# Patient Record
Sex: Male | Born: 1950 | Hispanic: Yes | Marital: Single | Smoking: Never smoker
Health system: Southern US, Community
[De-identification: ages and names within clinical notes are randomized; demographics above are authoritative.]

---

## 2018-02-22 ENCOUNTER — Encounter: Payer: Self-pay | Admitting: Physician Assistant

## 2018-02-22 ENCOUNTER — Emergency Department
Admission: EM | Admit: 2018-02-22 | Discharge: 2018-02-22 | Disposition: A | Discharge status: Home / Self Care | Payer: No Typology Code available for payment source | Attending: Emergency Medicine | Admitting: Emergency Medicine

## 2018-02-22 ENCOUNTER — Other Ambulatory Visit: Payer: Self-pay

## 2018-02-22 ENCOUNTER — Emergency Department: Payer: No Typology Code available for payment source

## 2018-02-22 DIAGNOSIS — Y939 Activity, unspecified: Secondary | ICD-10-CM | POA: Diagnosis not present

## 2018-02-22 DIAGNOSIS — S161XXA Strain of muscle, fascia and tendon at neck level, initial encounter: Secondary | ICD-10-CM | POA: Diagnosis not present

## 2018-02-22 DIAGNOSIS — M62838 Other muscle spasm: Secondary | ICD-10-CM | POA: Diagnosis not present

## 2018-02-22 DIAGNOSIS — M549 Dorsalgia, unspecified: Secondary | ICD-10-CM | POA: Insufficient documentation

## 2018-02-22 DIAGNOSIS — M25512 Pain in left shoulder: Secondary | ICD-10-CM | POA: Diagnosis not present

## 2018-02-22 DIAGNOSIS — S199XXA Unspecified injury of neck, initial encounter: Secondary | ICD-10-CM | POA: Diagnosis present

## 2018-02-22 DIAGNOSIS — Y92410 Unspecified street and highway as the place of occurrence of the external cause: Secondary | ICD-10-CM | POA: Insufficient documentation

## 2018-02-22 DIAGNOSIS — Y998 Other external cause status: Secondary | ICD-10-CM | POA: Diagnosis not present

## 2018-02-22 MED ORDER — BACLOFEN 10 MG PO TABS: 10.0000 mg | ORAL_TABLET | Freq: Every day | ORAL | 1 refills | Status: AC

## 2018-02-22 MED ORDER — MELOXICAM 15 MG PO TABS: 15.0000 mg | ORAL_TABLET | Freq: Every day | ORAL | 2 refills | Status: AC
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
## 2023-05-23 ENCOUNTER — Other Ambulatory Visit: Payer: Self-pay

## 2023-05-23 ENCOUNTER — Emergency Department: Payer: Self-pay

## 2023-05-23 ENCOUNTER — Emergency Department
Admission: EM | Admit: 2023-05-23 | Discharge: 2023-05-23 | Disposition: A | Discharge status: Home / Self Care | Payer: Self-pay | Attending: Emergency Medicine | Admitting: Emergency Medicine

## 2023-05-23 DIAGNOSIS — S61213A Laceration without foreign body of left middle finger without damage to nail, initial encounter: Secondary | ICD-10-CM | POA: Insufficient documentation

## 2023-05-23 DIAGNOSIS — Z23 Encounter for immunization: Secondary | ICD-10-CM | POA: Insufficient documentation

## 2023-05-23 DIAGNOSIS — W230XXA Caught, crushed, jammed, or pinched between moving objects, initial encounter: Secondary | ICD-10-CM | POA: Insufficient documentation

## 2023-05-23 DIAGNOSIS — S62633B Displaced fracture of distal phalanx of left middle finger, initial encounter for open fracture: Secondary | ICD-10-CM | POA: Insufficient documentation

## 2023-05-23 DIAGNOSIS — T148XXA Other injury of unspecified body region, initial encounter: Secondary | ICD-10-CM

## 2023-05-23 MED ORDER — CEPHALEXIN 500 MG PO CAPS: 500.0000 mg | ORAL_CAPSULE | Freq: Four times a day (QID) | ORAL | 0 refills | Status: AC

## 2023-05-23 MED ORDER — LIDOCAINE HCL 1 % IJ SOLN
10.0000 mL | Freq: Once | INTRAMUSCULAR | Status: AC
  Administered 2023-05-23: 10 mL
  Filled 2023-05-23: qty 10

## 2023-05-23 MED ORDER — TETANUS-DIPHTH-ACELL PERTUSSIS 5-2.5-18.5 LF-MCG/0.5 IM SUSY
0.5000 mL | PREFILLED_SYRINGE | Freq: Once | INTRAMUSCULAR | Status: AC
  Administered 2023-05-23: 0.5 mL via INTRAMUSCULAR
  Filled 2023-05-23: qty 0.5

## 2023-05-23 NOTE — ED Notes (Signed)
EDP at bedside  

## 2023-05-23 NOTE — Discharge Instructions (Addendum)
Tome [employer] cuatro veces al da durante los prximos [place].  Retirar las suturas en [employer].  Mantenga la herida limpia y seca durante las prximas veinticuatro horas.  Limpie la herida con agua y jabn una vez al da.  Por favor, haga un seguimiento con el Dr. Mathis Bud

## 2023-05-23 NOTE — ED Provider Triage Note (Signed)
Emergency Medicine Provider Triage Evaluation Note  Joseph Montoya , a 72 y.o. male  was evaluated in triage.  Pt complains of laceration left middle finger, unsure of last tdap.  Review of Systems  Positive:  Negative:   Physical Exam  There were no vitals taken for this visit. Gen:   Awake, no distress   Resp:  Normal effort  MSK:   Moves extremities without difficulty  Other:    Medical Decision Making  Medically screening exam initiated at 6:12 PM.  Appropriate orders placed.  Joseph Montoya was informed that the remainder of the evaluation will be completed by another provider, this initial triage assessment does not replace that evaluation, and the importance of remaining in the ED until their evaluation is complete.     Faythe Ghee, PA-C 05/23/23 1812

## 2023-05-23 NOTE — ED Triage Notes (Signed)
Pt to ED POV for injury to L middle finger from slamming it into metal doorframe. PA examined finger in triage. End of finger slightly crooked with deep laceration. Pt in severe pain. Unknown las Tdap.Marland Kitchen

## 2023-05-23 NOTE — ED Provider Notes (Signed)
[facility] Provider Note  Patient Contact: 8:38 PM (approximate)   History   Finger Injury   HPI  Joseph Montoya is a 72 y.o. male presents to the emergency department after patient had a crush type injury to the left middle finger.  Patient has a 3 cm x 3 cm laceration with fingernail disruption.  Tetanus status needs updating.  No numbness or tingling in the left hand.      Physical Exam   Triage Vital Signs: ED Triage Vitals [05/23/23 1812]  Enc Vitals Group     BP (!) 147/75     Pulse Rate 75     Resp 20     Temp 98.7 F (37.1 C)     Temp Source Oral     SpO2 100 %     Weight 195 lb (88.5 kg)     Height 5\' 3"  (1.6 m)     Head Circumference      Peak Flow      Pain Score 10     Pain Loc      Pain Edu?      Excl. in GC?     Most recent vital signs: Vitals:   05/23/23 1812 05/23/23 2027  BP: (!) 147/75 (!) 143/74  Pulse: 75 72  Resp: 20 18  Temp: 98.7 F (37.1 C) 98.2 F (36.8 C)  SpO2: 100% 100%     General: Alert and in no acute distress. Eyes:  PERRL. EOMI. Head: No acute traumatic findings ENT:      Nose: No congestion/rhinnorhea.      Mouth/Throat: Mucous membranes are moist. Neck: No stridor. No cervical spine tenderness to palpation. Cardiovascular:  Good peripheral perfusion Respiratory: Normal respiratory effort without tachypnea or retractions. Lungs CTAB. Good air entry to the bases with no decreased or absent breath sounds. Gastrointestinal: Bowel sounds 4 quadrants. Soft and nontender to palpation. No guarding or rigidity. No palpable masses. No distention. No CVA tenderness. Musculoskeletal: Patient performs limited range of motion at left middle finger.  Palpable radial ulnar pulses bilaterally and symmetrically. Neurologic:  No gross focal neurologic deficits are appreciated.  Skin: Patient has macerated 3 cm laceration with fingernail disruption.   ED Results / Procedures / Treatments   Labs (all labs  ordered are listed, but only abnormal results are displayed) Labs Reviewed - No data to display      RADIOLOGY  I personally viewed and evaluated these images as part of my medical decision making, as well as reviewing the written report by the radiologist.  ED Provider Interpretation: Comminuted fracture visualized at distal phalanx of left middle finger.   PROCEDURES:  Critical Care performed: No  ..Laceration Repair  Date/Time: 05/23/2023 8:40 PM  Performed by: Orvil Feil, PA-C Authorized by: Orvil Feil, PA-C   Consent:    Consent obtained:  Verbal   Risks discussed:  Infection and pain Universal protocol:    Procedure explained and questions answered to patient or proxy's satisfaction: yes     Patient identity confirmed:  Verbally with patient Anesthesia:    Anesthesia method:  Nerve block   Block anesthetic:  Lidocaine 1% w/o epi Laceration details:    Location:  Finger   Finger location:  L long finger   Length (cm):  3   Depth (mm):  5 Pre-procedure details:    Preparation:  Patient was prepped and draped in usual sterile fashion Exploration:    Limited defect created (wound extended): no  Imaging obtained: x-ray     Imaging outcome: foreign body not noted     Contaminated: yes   Treatment:    Area cleansed with:  Povidone-iodine   Amount of cleaning:  Extensive   Irrigation solution:  Sterile saline   Debridement:  None Skin repair:    Repair method:  Sutures   Suture size:  5-0   Suture technique:  Running locked   Number of sutures:  12 Approximation:    Approximation:  Close Repair type:    Repair type:  Intermediate Post-procedure details:    Dressing:  Non-adherent dressing, splint for protection and sterile dressing   Procedure completion:  Tolerated well, no immediate complications    MEDICATIONS ORDERED IN ED: Medications  Tdap (BOOSTRIX) injection 0.5 mL (0.5 mLs Intramuscular Given 05/23/23 1938)  lidocaine (XYLOCAINE) 1 %  (with pres) injection 10 mL (10 mLs Infiltration Given by Other 05/23/23 1954)     IMPRESSION / MDM / ASSESSMENT AND PLAN / ED COURSE  I reviewed the triage vital signs and the nursing notes.                              Assessment and plan Open fracture 72 year old male presents to the emergency department with a macerated laceration of the left middle finger with underlying open fracture.  Patient's laceration was repaired easily in the emergency department and a splint was provided for protection.  Patient was advised to follow-up with hand specialist, Dr. Stephenie Acres.  Return precautions were given to return with new or worsening symptoms.   FINAL CLINICAL IMPRESSION(S) / ED DIAGNOSES   Final diagnoses:  Open fracture     Rx / DC Orders   ED Discharge Orders          Ordered    cephALEXin (KEFLEX) 500 MG capsule  4 times daily        05/23/23 2017             Note:  This document was prepared using Dragon voice recognition software and may include unintentional dictation errors.   Pia Mau [place], Cordelia Poche 05/23/23 2042    Pilar Jarvis, MD 05/24/23 [phone]

## 2023-06-05 ENCOUNTER — Emergency Department
Admission: EM | Admit: 2023-06-05 | Discharge: 2023-06-05 | Disposition: A | Discharge status: Home / Self Care | Payer: Self-pay | Attending: Emergency Medicine | Admitting: Emergency Medicine

## 2023-06-05 ENCOUNTER — Other Ambulatory Visit: Payer: Self-pay

## 2023-06-05 DIAGNOSIS — Z5189 Encounter for other specified aftercare: Secondary | ICD-10-CM

## 2023-06-05 DIAGNOSIS — M7989 Other specified soft tissue disorders: Secondary | ICD-10-CM | POA: Insufficient documentation

## 2023-06-05 DIAGNOSIS — Z4800 Encounter for change or removal of nonsurgical wound dressing: Secondary | ICD-10-CM | POA: Insufficient documentation

## 2023-06-05 DIAGNOSIS — X58XXXD Exposure to other specified factors, subsequent encounter: Secondary | ICD-10-CM | POA: Insufficient documentation

## 2023-06-05 DIAGNOSIS — S62663D Nondisplaced fracture of distal phalanx of left middle finger, subsequent encounter for fracture with routine healing: Secondary | ICD-10-CM | POA: Insufficient documentation

## 2023-06-05 MED ORDER — HYDROCODONE-ACETAMINOPHEN 5-325 MG PO TABS: 1.0000 | ORAL_TABLET | Freq: Once | ORAL | Status: DC

## 2023-06-05 MED ORDER — HYDROCODONE-ACETAMINOPHEN 5-325 MG PO TABS
1.0000 | ORAL_TABLET | Freq: Once | ORAL | Status: AC
  Administered 2023-06-05: 1 via ORAL
  Filled 2023-06-05: qty 1

## 2023-06-05 MED ORDER — HYDROCODONE-ACETAMINOPHEN 5-325 MG PO TABS: 1.0000 | ORAL_TABLET | ORAL | 0 refills | Status: AC | PRN
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
## 2023-06-05 NOTE — ED Provider Notes (Signed)
[facility] Provider Note  Patient Contact: 5:56 PM (approximate)   History   Finger Injury   HPI  Joseph Montoya is a 72 y.o. male who presents the emergency department for evaluation of his finger.  Patient was seen in this department roughly 10 days ago with an open fracture of the middle finger of the left hand.  Patient had this splinted, was placed in a splint.  According to family they remove the splint so they can change the dressing, have not had him in a splint ongoing.  They were concerned as he had some clear drainage from bowel to the nailbed.  There is been no pus.  No gross erythema to the finger still edematous from the original trauma.  Patient has not followed up with orthopedic.     Physical Exam   Triage Vital Signs: ED Triage Vitals  Encounter Vitals Group     BP 06/05/23 1642 (!) 164/74     Systolic BP Percentile --      Diastolic BP Percentile --      Pulse Rate 06/05/23 1642 86     Resp 06/05/23 1642 18     Temp 06/05/23 1642 98.6 F (37 C)     Temp src --      SpO2 06/05/23 1642 95 %     Weight 06/05/23 1644 194 lb 0.1 oz (88 kg)     Height 06/05/23 1644 5\' 3"  (1.6 m)     Head Circumference --      Peak Flow --      Pain Score 06/05/23 1642 10     Pain Loc --      Pain Education --      Exclude from Growth Chart --     Most recent vital signs: Vitals:   06/05/23 1642  BP: (!) 164/74  Pulse: 86  Resp: 18  Temp: 98.6 F (37 C)  SpO2: 95%     General: Alert and in no acute distress   Cardiovascular:  Good peripheral perfusion Respiratory: Normal respiratory effort without tachypnea or retractions. Lungs CTAB. Musculoskeletal: Full range of motion to all extremities.  Visualization of the middle finger of the left hand reveals in place stitches, no gross edema of the digit and some residual ecchymosis.  There is no erythema.  There is no purulent drainage.  Wound appears to be healing at this time though there is  concerned that his sutures are removed at this time there may be dehiscence of this wound.   Neurologic:  No gross focal neurologic deficits are appreciated.  Skin:   No rash noted Other:   ED Results / Procedures / Treatments   Labs (all labs ordered are listed, but only abnormal results are displayed) Labs Reviewed - No data to display   EKG     RADIOLOGY    No results found.  PROCEDURES:  Critical Care performed: No  Procedures   MEDICATIONS ORDERED IN ED: Medications  HYDROcodone-acetaminophen (NORCO/VICODIN) 5-325 MG per tablet 1 tablet (has no administration in time range)     IMPRESSION / MDM / ASSESSMENT AND PLAN / ED COURSE  I reviewed the triage vital signs and the nursing notes.                                 Differential diagnosis includes, but is not limited to, suture removal, wound infection, wound dehiscence, worsening fracture  alignment, osteomyelitis   Patient's presentation is most consistent with acute presentation with potential threat to life or bodily function.   Patient's diagnosis is consistent with open fracture, subsequent encounter.  Patient presents the emergency department for reevaluation of an open fracture he sustained roughly 10 days ago.  Patient was seen in this department, wound was sutured.  Patient was referred to orthopedics but has not followed up.  They were concerned as he has ongoing pain, swelling and drainage from the finger.  It appears that patient has some serosanguineous type drainage from the laceration component though there is no evidence of infection on physical exam.  Patient's finger is still edematous from original trauma was still residual ecchymosis.  At this time I recommend resplinting the patient's finger, having the patient follow-up with orthopedics.  I do not feel that wound is ready for suture removal at this time.  These instructions are discussed with patient and family and they are agreeable with  this plan..  Patient is given ED precautions to return to the ED for any worsening or new symptoms.     FINAL CLINICAL IMPRESSION(S) / ED DIAGNOSES   Final diagnoses:  Open nondisplaced fracture of distal phalanx of left middle finger with routine healing, subsequent encounter  Visit for wound check     Rx / DC Orders   ED Discharge Orders          Ordered    HYDROcodone-acetaminophen (NORCO/VICODIN) 5-325 MG tablet  Every 4 hours PRN        06/05/23 1803             Note:  This document was prepared using Dragon voice recognition software and may include unintentional dictation errors.   Lanette Hampshire 06/05/23 1803    Jene Every, MD 06/05/23 [phone]

## 2024-12-15 ENCOUNTER — Emergency Department: Payer: Self-pay

## 2024-12-15 ENCOUNTER — Inpatient Hospital Stay
Admission: EM | Admit: 2024-12-15 | Payer: Self-pay | Source: Home / Self Care | Attending: Internal Medicine | Admitting: Internal Medicine

## 2024-12-15 ENCOUNTER — Inpatient Hospital Stay: Payer: Self-pay

## 2024-12-15 ENCOUNTER — Other Ambulatory Visit: Payer: Self-pay

## 2024-12-15 DIAGNOSIS — R0602 Shortness of breath: Secondary | ICD-10-CM

## 2024-12-15 DIAGNOSIS — I509 Heart failure, unspecified: Principal | ICD-10-CM

## 2024-12-15 DIAGNOSIS — D696 Thrombocytopenia, unspecified: Secondary | ICD-10-CM | POA: Insufficient documentation

## 2024-12-15 DIAGNOSIS — R7989 Other specified abnormal findings of blood chemistry: Secondary | ICD-10-CM | POA: Insufficient documentation

## 2024-12-15 DIAGNOSIS — I10 Essential (primary) hypertension: Secondary | ICD-10-CM | POA: Insufficient documentation

## 2024-12-15 DIAGNOSIS — I4891 Unspecified atrial fibrillation: Secondary | ICD-10-CM | POA: Insufficient documentation

## 2024-12-15 LAB — CBC
HCT: 42.2 % (ref 39.0–52.0)
Hemoglobin: 13.9 g/dL (ref 13.0–17.0)
MCH: 32.8 pg (ref 26.0–34.0)
MCHC: 32.9 g/dL (ref 30.0–36.0)
MCV: 99.5 fL (ref 80.0–100.0)
Platelets: 131 10*3/uL — ABNORMAL LOW (ref 150–400)
RBC: 4.24 MIL/uL (ref 4.22–5.81)
RDW: 13.9 % (ref 11.5–15.5)
WBC: 7 10*3/uL (ref 4.0–10.5)
nRBC: 0 % (ref 0.0–0.2)

## 2024-12-15 LAB — BASIC METABOLIC PANEL WITH GFR
Anion gap: 9 (ref 5–15)
BUN: 14 mg/dL (ref 8–23)
CO2: 25 mmol/L (ref 22–32)
Calcium: 9.3 mg/dL (ref 8.9–10.3)
Chloride: 108 mmol/L (ref 98–111)
Creatinine, Ser: 0.87 mg/dL (ref 0.61–1.24)
GFR, Estimated: >60 mL/min
Glucose, Bld: 126 mg/dL — ABNORMAL HIGH (ref 70–99)
Potassium: 3.9 mmol/L (ref 3.5–5.1)
Sodium: 142 mmol/L (ref 135–145)

## 2024-12-15 LAB — MAGNESIUM: Magnesium: 2.1 mg/dL (ref 1.7–2.4)

## 2024-12-15 LAB — LIPID PANEL
Cholesterol: 164 mg/dL (ref 0–200)
HDL: 34 mg/dL — ABNORMAL LOW
LDL Cholesterol: 103 mg/dL — ABNORMAL HIGH (ref 0–99)
Total CHOL/HDL Ratio: 4.9 ratio
Triglycerides: 134 mg/dL
VLDL: 27 mg/dL (ref 0–40)

## 2024-12-15 LAB — HEPATIC FUNCTION PANEL
ALT: 79 U/L — ABNORMAL HIGH (ref 0–44)
AST: 40 U/L (ref 15–41)
Albumin: 4.3 g/dL (ref 3.5–5.0)
Alkaline Phosphatase: 115 U/L (ref 38–126)
Bilirubin, Direct: 0.3 mg/dL — ABNORMAL HIGH (ref 0.0–0.2)
Indirect Bilirubin: 0.5 mg/dL (ref 0.3–0.9)
Total Bilirubin: 0.7 mg/dL (ref 0.0–1.2)
Total Protein: 7.1 g/dL (ref 6.5–8.1)

## 2024-12-15 LAB — TROPONIN T, HIGH SENSITIVITY
Troponin T High Sensitivity: 24 ng/L — ABNORMAL HIGH (ref 0–19)
Troponin T High Sensitivity: 24 ng/L — ABNORMAL HIGH (ref 0–19)

## 2024-12-15 LAB — IMMATURE PLATELET FRACTION: Immature Platelet Fraction: 8.3 % (ref 1.2–8.6)

## 2024-12-15 LAB — TSH: TSH: 2.8 u[IU]/mL (ref 0.350–4.500)

## 2024-12-15 LAB — PRO BRAIN NATRIURETIC PEPTIDE: Pro Brain Natriuretic Peptide: 1984 pg/mL — ABNORMAL HIGH

## 2024-12-15 MED ORDER — SENNOSIDES-DOCUSATE SODIUM 8.6-50 MG PO TABS
1.0000 | ORAL_TABLET | Freq: Every evening | ORAL | Status: AC | PRN
  Administered 2024-12-17: 1 via ORAL
  Filled 2024-12-15: qty 1

## 2024-12-15 MED ORDER — SODIUM CHLORIDE 0.9 % IV SOLN
100.0000 mg | Freq: Two times a day (BID) | INTRAVENOUS | Status: AC
  Administered 2024-12-15 – 2024-12-18 (×6): 100 mg via INTRAVENOUS
  Filled 2024-12-15 (×7): qty 100

## 2024-12-15 MED ORDER — SODIUM CHLORIDE 0.9% FLUSH
3.0000 mL | Freq: Two times a day (BID) | INTRAVENOUS | Status: AC
  Administered 2024-12-15 – 2024-12-20 (×11): 3 mL via INTRAVENOUS

## 2024-12-15 MED ORDER — DILTIAZEM HCL 25 MG/5ML IV SOLN
15.0000 mg | Freq: Once | INTRAVENOUS | Status: AC
  Administered 2024-12-15: 15 mg via INTRAVENOUS
  Filled 2024-12-15: qty 5

## 2024-12-15 MED ORDER — IOHEXOL 350 MG/ML SOLN
100.0000 mL | Freq: Once | INTRAVENOUS | Status: AC | PRN
  Administered 2024-12-15: 100 mL via INTRAVENOUS

## 2024-12-15 MED ORDER — ONDANSETRON HCL 4 MG PO TABS: 4.0000 mg | ORAL_TABLET | Freq: Four times a day (QID) | ORAL | Status: AC | PRN

## 2024-12-15 MED ORDER — DILTIAZEM HCL 25 MG/5ML IV SOLN
10.0000 mg | Freq: Once | INTRAVENOUS | Status: AC
  Administered 2024-12-15: 10 mg via INTRAVENOUS
  Filled 2024-12-15: qty 5

## 2024-12-15 MED ORDER — FUROSEMIDE 10 MG/ML IJ SOLN
40.0000 mg | Freq: Once | INTRAMUSCULAR | Status: AC
  Administered 2024-12-15: 40 mg via INTRAVENOUS
  Filled 2024-12-15: qty 4

## 2024-12-15 MED ORDER — POTASSIUM CHLORIDE 20 MEQ PO PACK
20.0000 meq | PACK | Freq: Two times a day (BID) | ORAL | Status: DC
  Administered 2024-12-15 – 2024-12-17 (×5): 20 meq via ORAL
  Filled 2024-12-15 (×5): qty 1

## 2024-12-15 MED ORDER — ACETAMINOPHEN 650 MG RE SUPP: 650.0000 mg | Freq: Four times a day (QID) | RECTAL | Status: AC | PRN

## 2024-12-15 MED ORDER — METOPROLOL TARTRATE 5 MG/5ML IV SOLN
2.5000 mg | INTRAVENOUS | Status: DC | PRN
  Administered 2024-12-15: 2.5 mg via INTRAVENOUS
  Filled 2024-12-15: qty 5

## 2024-12-15 MED ORDER — ACETAMINOPHEN 325 MG PO TABS
650.0000 mg | ORAL_TABLET | Freq: Four times a day (QID) | ORAL | Status: AC | PRN
  Administered 2024-12-16 – 2024-12-19 (×6): 650 mg via ORAL
  Filled 2024-12-15 (×6): qty 2

## 2024-12-15 MED ORDER — ONDANSETRON HCL 4 MG/2ML IJ SOLN: 4.0000 mg | Freq: Four times a day (QID) | INTRAMUSCULAR | Status: AC | PRN

## 2024-12-15 MED ORDER — FUROSEMIDE 10 MG/ML IJ SOLN
40.0000 mg | Freq: Two times a day (BID) | INTRAMUSCULAR | Status: AC
  Administered 2024-12-15 – 2024-12-20 (×11): 40 mg via INTRAVENOUS
  Filled 2024-12-15 (×11): qty 4

## 2024-12-15 MED ORDER — METOPROLOL TARTRATE 5 MG/5ML IV SOLN
2.5000 mg | INTRAVENOUS | Status: AC | PRN
  Administered 2024-12-15 – 2024-12-16 (×2): 2.5 mg via INTRAVENOUS
  Filled 2024-12-15 (×2): qty 5

## 2024-12-15 MED ORDER — SODIUM CHLORIDE 0.9 % IV SOLN
2.0000 g | INTRAVENOUS | Status: AC
  Administered 2024-12-15 – 2024-12-17 (×3): 2 g via INTRAVENOUS
  Filled 2024-12-15 (×3): qty 20

## 2024-12-15 NOTE — ED Notes (Signed)
 Pt wanting to drink water so requesting BiPap mask be taken off. Pt placed on 4L [place], respirations even, non labored. Respiratory notified.

## 2024-12-15 NOTE — ED Provider Notes (Signed)
 [facility] Provider Note    Event Date/Time   First MD Initiated Contact with Patient 12/15/24 1456     (approximate)   History   No chief complaint on file.   HPI  Joseph Montoya is a 74 y.o. male presenting with shortness of breath for the past week, also notes abdominal distention.  He denies any chest pain or cough.  States no prior medical history, no history of any cardiac issues.  Denies hypertension or diabetes.  Denies any fever.  Denies frequent alcohol use, no history of liver cirrhosis or liver issues, denies history of renal problems.  Per independent history from family, patient is otherwise healthy.  No cardiac history, no history of CHF or arrhythmia.  Does not take any medications on a regular basis.  Spanish interpreter was used for encounter.     Physical Exam   Triage Vital Signs: ED Triage Vitals  Encounter Vitals Group     BP 12/15/24 1425 137/61     Girls Systolic BP Percentile --      Girls Diastolic BP Percentile --      Boys Systolic BP Percentile --      Boys Diastolic BP Percentile --      Pulse Rate 12/15/24 1425 (!) 132     Resp 12/15/24 1425 20     Temp 12/15/24 1425 97.8 F (36.6 C)     Temp Source 12/15/24 1425 Oral     SpO2 12/15/24 1425 94 %     Weight 12/15/24 1426 194 lb 0.1 oz (88 kg)     Height --      Head Circumference --      Peak Flow --      Pain Score 12/15/24 1426 9     Pain Loc --      Pain Education --      Exclude from Growth Chart --     Most recent vital signs: Vitals:   12/15/24 1530 12/15/24 1630  BP: (!) 143/105 (!) 141/97  Pulse: (!) 101 (!) 159  Resp: (!) 22 (!) 22  Temp:    SpO2: 93% 98%     General: Awake, no distress.  CV:  Good peripheral perfusion.  Resp:  Normal effort.  Tachypneic, having increased work of breathing Abd:  Soft, nontender, mildly distended Other:  Trace lower extremity edema bilaterally, appears symmetrical   ED Results / Procedures / Treatments    Labs (all labs ordered are listed, but only abnormal results are displayed) Labs Reviewed  BASIC METABOLIC PANEL WITH GFR - Abnormal; Notable for the following components:      Result Value   Glucose, Bld 126 (*)    All other components within normal limits  CBC - Abnormal; Notable for the following components:   Platelets 131 (*)    All other components within normal limits  PRO BRAIN NATRIURETIC PEPTIDE - Abnormal; Notable for the following components:   Pro Brain Natriuretic Peptide 1,984.0 (*)    All other components within normal limits  HEPATIC FUNCTION PANEL - Abnormal; Notable for the following components:   ALT 79 (*)    Bilirubin, Direct 0.3 (*)    All other components within normal limits  TROPONIN T, HIGH SENSITIVITY - Abnormal; Notable for the following components:   Troponin T High Sensitivity 24 (*)    All other components within normal limits  TROPONIN T, HIGH SENSITIVITY - Abnormal; Notable for the following components:   Troponin T  High Sensitivity 24 (*)    All other components within normal limits     EKG  EKG shows, atrial fibrillation with RVR, rate 144, normal QS, normal QTc, no obvious ischemic ST elevation, T wave flattening to aVL, no prior to compare   RADIOLOGY On my independent interpretation, chest x-ray shows vascular congestion   PROCEDURES:  Critical Care performed: Yes, see critical care procedure note(s)  .Critical Care  Performed by: Waymond Lorelle Cummins, MD Authorized by: Waymond Lorelle Cummins, MD   Critical care provider statement:    Critical care time (minutes):  40   Critical care was time spent personally by me on the following activities:  Development of treatment plan with patient or surrogate, discussions with consultants, evaluation of patient's response to treatment, examination of patient, ordering and review of laboratory studies, ordering and review of radiographic studies, ordering and performing treatments and interventions, pulse  oximetry, re-evaluation of patient's condition and review of old charts    MEDICATIONS ORDERED IN ED: Medications  furosemide  (LASIX ) injection 40 mg (40 mg Intravenous Given 12/15/24 1553)  diltiazem  (CARDIZEM ) injection 10 mg (10 mg Intravenous Given 12/15/24 1550)  diltiazem  (CARDIZEM ) injection 15 mg (15 mg Intravenous Given 12/15/24 1717)     IMPRESSION / MDM / ASSESSMENT AND PLAN / ED COURSE  I reviewed the triage vital signs and the nursing notes.                              Differential diagnosis includes, but is not limited to, arrhythmia, electrolyte derangements, CHF exacerbation, volume overload, atypical ACS.  Labs were obtained out of triage, will add a BNP, will give him some IV Lasix  here, IV Dilt, BiPAP.  He will need to be admitted for further management.  Patient's presentation is most consistent with acute presentation with potential threat to life or bodily function.  Independent interpretation of labs and imaging below.  Clinical course as below.  Has new onset CHF, A-fib RVR, he will need to be admitted for further management.  Consulted hospitalist, he is admitted.   Clinical Course as of 12/15/24 1744  Sun Dec 15, 2024  1459 DG Chest [place] 1 View IMPRESSION: 1. Cardiomegaly with pulmonary vascular congestion. 2. Interstitial prominence bilaterally with airspace disease at the left lung base, possible edema or infiltrate.   [TT]  1544 Independent review of labs, troponins not severely deranged, troponins mildly elevated, BNP is elevated, no leukocytosis. [TT]  1742 Heart rate after repeat Dilt now 100-110s. [TT]    Clinical Course User Index [TT] Waymond Lorelle Cummins, MD     FINAL CLINICAL IMPRESSION(S) / ED DIAGNOSES   Final diagnoses:  Acute on chronic congestive heart failure, unspecified heart failure type (HCC)  Atrial fibrillation with rapid ventricular response (HCC)  Shortness of breath     Rx / DC Orders   ED Discharge Orders     None         Note:  This document was prepared using Dragon voice recognition software and may include unintentional dictation errors.    Waymond Lorelle Cummins, MD 12/15/24 [phone]

## 2024-12-15 NOTE — ED Notes (Signed)
 Called CCMD for pt monitoring

## 2024-12-15 NOTE — ED Triage Notes (Signed)
 Arrives c/o one week history of SOB.  Worse with excertion. Denies fever/chills. No history of same.  Also c/o chest and upper back x 1 week.

## 2024-12-15 NOTE — H&P (Addendum)
 " History and Physical    [name]:[id number] DOB: [birth year] DOA: 12/15/2024  DOS: the patient was seen and examined on 12/15/2024  PCP: Pcp, No   Patient coming from: Home  I have personally briefly reviewed patient's old medical records in [facility] and CareEverywhere  HPI:   Joseph Montoya is a 74 y.o. year old male without significant medical history presenting to the ED with worsening shortness of breath, chest pain with radiation to the back for 1 week.  Interpreter utilized for this interview.  He states his symptoms have started slowly and have been progressing for 1 week.  He does not recall an inciting factor.  He states he last saw his primary care doctor 9 months ago.  He is unable to recall the name of the office.  He states he does not have any medical problems and does not take any medications.  He denies any sick contacts.  Denies any fevers or chills.  He does report orthopnea and abdominal distention for 1 week.  He does not recall his baseline weight.  He was previously a heavy drinker but has stopped for 1 year since he has moved to live with his daughter.  He was previously smoking 1/4 pack per day but has stopped that over 1 year as well. On arrival to the ED patient was noted to be HDS stable.  Patient was noted to be in RVR so diltiazem  push was given.  Lab work and imaging obtained.  CBC without leukocytosis but mild thrombocytopenia.  BMP with mild hyperglycemia otherwise unremarkable with normal electrolytes.  Potassium noted with 3.9.  Troponin mildly elevated 24 with repeat at 24.  proBNP elevated at 1984.  Hepatic function with elevated ALT.  Chest x-ray with cardiomegaly and concerning for vascular congestion.  There is mediastinal enlargement on my review of chest x-ray.  Patient given IV Lasix  and TRH consulted for admission and further management.  Review of Systems: As mentioned in the history of present illness. All other systems reviewed and are  negative.   Medical history: No reported past medical history  No past surgical history on file.   Allergies[1]  No family history on file.  Medications: No reported chronic medications  Social History:  reports that he has never smoked. He has never used smokeless tobacco. He reports that he does not currently use alcohol. He reports that he does not use drugs. Lives with daughter, not currently smoking or drinking.  Independent ADLs and IADLs   Physical Exam: Vitals:   12/15/24 1630 12/15/24 1700 12/15/24 1715 12/15/24 1800  BP: (!) 141/97 (!) 140/115  119/89  Pulse: (!) 159 (!) 162 83 (!) 49  Resp: (!) 22 (!) 23 (!) 21 17  Temp:      TempSrc:      SpO2: 98% 99% 98% 100%  Weight:        Gen: NAD HENT: NCAT, BiPAP in place CV: Irregularly irregular, good pulses, unable to evaluate JVD given body habitus, 1+ pitting edema Lung: Bibasilar rales Abd: No TTP, distended abdomen, normal bowel sounds MSK: No asymmetry, good bulk and tone, skin tag noted on right mid back Neuro: alert and oriented x 4   Labs on Admission: I have personally reviewed following labs and imaging studies  CBC: Recent Labs  Lab 12/15/24 1434  WBC 7.0  HGB 13.9  HCT 42.2  MCV 99.5  PLT 131*   Basic Metabolic Panel: Recent Labs  Lab 12/15/24 1434  NA 142  K 3.9  CL 108  CO2 25  GLUCOSE 126*  BUN 14  CREATININE 0.87  CALCIUM 9.3   GFR: CrCl cannot be calculated (Unknown ideal weight.). Liver Function Tests: Recent Labs  Lab 12/15/24 1434  AST 40  ALT 79*  ALKPHOS 115  BILITOT 0.7  PROT 7.1  ALBUMIN 4.3   No results for input(s): LIPASE, AMYLASE in the last 168 hours. No results for input(s): AMMONIA in the last 168 hours. Coagulation Profile: No results for input(s): INR, PROTIME in the last 168 hours. Cardiac Enzymes: No results for input(s): CKTOTAL, CKMB, CKMBINDEX, TROPONINI, TROPONINIHS in the last 168 hours. BNP (last 3 results) No results  for input(s): BNP in the last 8760 hours. HbA1C: No results for input(s): HGBA1C in the last 72 hours. CBG: No results for input(s): GLUCAP in the last 168 hours. Lipid Profile: No results for input(s): CHOL, HDL, LDLCALC, TRIG, CHOLHDL, LDLDIRECT in the last 72 hours. Thyroid Function Tests: No results for input(s): TSH, T4TOTAL, FREET4, T3FREE, THYROIDAB in the last 72 hours. Anemia Panel: No results for input(s): VITAMINB12, FOLATE, FERRITIN, TIBC, IRON, RETICCTPCT in the last 72 hours. Urine analysis: No results found for: COLORURINE, APPEARANCEUR, LABSPEC, PHURINE, GLUCOSEU, HGBUR, BILIRUBINUR, KETONESUR, PROTEINUR, UROBILINOGEN, NITRITE, LEUKOCYTESUR  Radiological Exams on Admission: I have personally reviewed images DG Chest Port 1 View Result Date: 12/15/2024 CLINICAL DATA:  Shortness of breath. EXAM: PORTABLE CHEST 1 VIEW COMPARISON:  None Available. FINDINGS: Heart is markedly enlarged and the mediastinal contour is prominent. There is atherosclerotic calcification of the aorta. The pulmonary vasculature is distended. Interstitial prominence is present bilaterally with mild airspace disease at the left lung base. No effusion or pneumothorax is seen. No acute osseous abnormality. IMPRESSION: 1. Cardiomegaly with pulmonary vascular congestion. 2. Interstitial prominence bilaterally with airspace disease at the left lung base, possible edema or infiltrate. Electronically Signed   By: Leita Birmingham M.D.   On: 12/15/2024 14:47    EKG: My personal interpretation of EKG shows: A-fib with RVR without any acute ST changes.  There is a fascicular block present.    Assessment/Plan Principal Problem:   New onset of congestive heart failure (HCC) Active Problems:   Atrial fibrillation with RVR (HCC)   Thrombocytopenia   Elevated LFTs   Essential hypertension   New onset CHF Pt without hx of CHF and signs of volume overload  on exam and imaging suggestive of CHF exacerbation.  This would be a new diagnosis for the patient.  Suspect he has uncontrolled risk factors including hypertension and hyperlipidemia.  Lipid panel ordered.  Will consult cardiology for further workup.  Patient with report of chest pain with radiation to the back along with enlarged mediastinum there is concern for dissection so CTA ordered.  Patient was placed on BiPAP due to increased work of breathing and pulmonary edema but patient was not hypoxic.  Will wean BiPAP as patient is diuresed. - Admit to stepdown given concern for dissection -Echocardiogram ordered - Start IV Furosemide  40 mg BID  - Trend BMP, follow mag (goal K>4 and Mag>2) - Strict I&Os - Daily Weights  Addendum: dissection study negative. 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 It showed left upper lobe bronchopneumonia so will cover with CAP coverage and needs repeat imaging in 3 months to ensure resolution.   Afib with RVR Patient without history of paroxysmal A-fib presenting with A-fib with RVR along with CHF exacerbation as above.  K was  3.9 and Mag is pending. Will replete with goal of 4 and 2 respectively.  - Continue IV metoprolol  pushes for heart rate greater than 110.  If continues to be in RVR will start amiodarone  as I am unsure of his EF.  Echocardiogram has been ordered.  Thrombocytopenia: Exact etiology of this is unknown but may be related to his alcohol use history.  He would have underlying cirrhosis (see below).  Smear and IPF ordered  Elevated LFTs: Right upper quadrant ultrasound ordered.  This could be from hepatic congestion versus underlying liver pathology.  Will follow-up on right upper quadrant and diuresis as.  Hypertension: Patient with previous elevated blood pressures on outpatient visits with this diagnosis admitted.  Will start him on appropriate regimen based on EF and echo findings.  Given his A-fib with RVR  he is currently normotensive this is reassuring.   VTE prophylaxis:  SCDs  Diet: N.p.o. Code Status:  Full Code Telemetry:  Admission status: Inpatient, Step Down Unit Patient is from: Home Anticipated d/c is to: Home Anticipated d/c is in: 2-3 days   Family Communication: Updated at bedside  Consults called: Cardiology   Severity of Illness: The appropriate patient status for this patient is OBSERVATION. Observation status is judged to be reasonable and necessary in order to provide the required intensity of service to ensure the patient's safety. The patient's presenting symptoms, physical exam findings, and initial radiographic and laboratory data in the context of their medical condition is felt to place them at decreased risk for further clinical deterioration. Furthermore, it is anticipated that the patient will be medically stable for discharge from the hospital within 2 midnights of admission.    Morene Bathe, MD [facility] DEL. [facility]     [1] No Known Allergies  "

## 2024-12-16 ENCOUNTER — Other Ambulatory Visit (HOSPITAL_COMMUNITY): Payer: Self-pay

## 2024-12-16 ENCOUNTER — Inpatient Hospital Stay: Admit: 2024-12-16 | Discharge: 2024-12-16 | Disposition: A | Payer: Self-pay | Attending: Internal Medicine

## 2024-12-16 ENCOUNTER — Telehealth (HOSPITAL_COMMUNITY): Payer: Self-pay | Admitting: Pharmacy Technician

## 2024-12-16 LAB — RESPIRATORY PANEL BY PCR

## 2024-12-16 LAB — CBC
HCT: 40.3 % (ref 39.0–52.0)
Hemoglobin: 13.2 g/dL (ref 13.0–17.0)
MCH: 32.8 pg (ref 26.0–34.0)
MCHC: 32.8 g/dL (ref 30.0–36.0)
MCV: 100.2 fL — ABNORMAL HIGH (ref 80.0–100.0)
Platelets: 130 10*3/uL — ABNORMAL LOW (ref 150–400)
RBC: 4.02 MIL/uL — ABNORMAL LOW (ref 4.22–5.81)
RDW: 13.8 % (ref 11.5–15.5)
WBC: 7.6 10*3/uL (ref 4.0–10.5)
nRBC: 0 % (ref 0.0–0.2)

## 2024-12-16 LAB — ECHOCARDIOGRAM COMPLETE
AR max vel: 3.04 cm2
AV Area VTI: 4.05 cm2
AV Area mean vel: 2.81 cm2
AV Mean grad: 4 mmHg
AV Peak grad: 7.7 mmHg
Ao pk vel: 1.39 m/s
Calc EF: 52.1 %
Height: 63 in
MV M vel: 4.48 m/s
MV Peak grad: 80.3 mmHg
P 1/2 time: 376 ms
Radius: 0.7 cm
S' Lateral: 3.4 cm
Single Plane A2C EF: 51.9 %
Single Plane A4C EF: 51.9 %
Weight: 3901.26 [oz_av]

## 2024-12-16 LAB — CBC WITH DIFFERENTIAL/PLATELET
Abs Immature Granulocytes: 0.05 10*3/uL (ref 0.00–0.07)
Basophils Absolute: 0 10*3/uL (ref 0.0–0.1)
Basophils Relative: 0 %
Eosinophils Absolute: 0.1 10*3/uL (ref 0.0–0.5)
Eosinophils Relative: 1 %
HCT: 43.5 % (ref 39.0–52.0)
Hemoglobin: 14 g/dL (ref 13.0–17.0)
Immature Granulocytes: 1 %
Lymphocytes Relative: 22 %
Lymphs Abs: 1.5 10*3/uL (ref 0.7–4.0)
MCH: 32.6 pg (ref 26.0–34.0)
MCHC: 32.2 g/dL (ref 30.0–36.0)
MCV: 101.2 fL — ABNORMAL HIGH (ref 80.0–100.0)
Monocytes Absolute: 0.7 10*3/uL (ref 0.1–1.0)
Monocytes Relative: 11 %
Neutro Abs: 4.6 10*3/uL (ref 1.7–7.7)
Neutrophils Relative %: 65 %
Platelets: 138 10*3/uL — ABNORMAL LOW (ref 150–400)
RBC: 4.3 MIL/uL (ref 4.22–5.81)
RDW: 14.2 % (ref 11.5–15.5)
WBC: 7 10*3/uL (ref 4.0–10.5)
nRBC: 0 % (ref 0.0–0.2)

## 2024-12-16 LAB — APTT: aPTT: 25 s (ref 24–36)

## 2024-12-16 LAB — BASIC METABOLIC PANEL WITH GFR
Anion gap: 11 (ref 5–15)
BUN: 15 mg/dL (ref 8–23)
CO2: 25 mmol/L (ref 22–32)
Calcium: 9 mg/dL (ref 8.9–10.3)
Chloride: 106 mmol/L (ref 98–111)
Creatinine, Ser: 0.81 mg/dL (ref 0.61–1.24)
GFR, Estimated: >60 mL/min
Glucose, Bld: 108 mg/dL — ABNORMAL HIGH (ref 70–99)
Potassium: 4.2 mmol/L (ref 3.5–5.1)
Sodium: 142 mmol/L (ref 135–145)

## 2024-12-16 LAB — PROTIME-INR
INR: 1 (ref 0.8–1.2)
Prothrombin Time: 14.2 s (ref 11.4–15.2)

## 2024-12-16 LAB — HEMOGLOBIN A1C
Hgb A1c MFr Bld: 6.3 % — ABNORMAL HIGH (ref 4.8–5.6)
Mean Plasma Glucose: 134.11 mg/dL

## 2024-12-16 LAB — TECHNOLOGIST SMEAR REVIEW
Plt Morphology: NORMAL
WBC MORPHOLOGY: >10

## 2024-12-16 LAB — GLUCOSE, CAPILLARY: Glucose-Capillary: 103 mg/dL — ABNORMAL HIGH (ref 70–99)

## 2024-12-16 MED ORDER — HEPARIN BOLUS VIA INFUSION
4000.0000 [IU] | Freq: Once | INTRAVENOUS | Status: AC
  Administered 2024-12-16: 4000 [IU] via INTRAVENOUS
  Filled 2024-12-16: qty 4000

## 2024-12-16 MED ORDER — METOPROLOL TARTRATE 25 MG PO TABS
25.0000 mg | ORAL_TABLET | Freq: Two times a day (BID) | ORAL | Status: DC
  Administered 2024-12-16: 25 mg via ORAL
  Filled 2024-12-16: qty 1

## 2024-12-16 MED ORDER — APIXABAN 5 MG PO TABS
5.0000 mg | ORAL_TABLET | Freq: Two times a day (BID) | ORAL | Status: AC
  Administered 2024-12-16 – 2024-12-20 (×9): 5 mg via ORAL
  Filled 2024-12-16 (×9): qty 1

## 2024-12-16 MED ORDER — METOPROLOL TARTRATE 25 MG PO TABS
25.0000 mg | ORAL_TABLET | Freq: Three times a day (TID) | ORAL | Status: DC
  Administered 2024-12-16 – 2024-12-17 (×3): 25 mg via ORAL
  Filled 2024-12-16 (×3): qty 1

## 2024-12-16 MED ORDER — AMIODARONE IV BOLUS ONLY 150 MG/100ML
150.0000 mg | Freq: Once | INTRAVENOUS | Status: AC
  Administered 2024-12-16: 150 mg via INTRAVENOUS
  Filled 2024-12-16: qty 100

## 2024-12-16 MED ORDER — AMOXICILLIN-POT CLAVULANATE 875-125 MG PO TABS
1.0000 | ORAL_TABLET | Freq: Two times a day (BID) | ORAL | Status: AC
  Administered 2024-12-18 – 2024-12-19 (×4): 1 via ORAL
  Filled 2024-12-16 (×4): qty 1

## 2024-12-16 MED ORDER — METOPROLOL TARTRATE 5 MG/5ML IV SOLN
2.5000 mg | INTRAVENOUS | Status: DC | PRN
  Administered 2024-12-16 – 2024-12-17 (×2): 2.5 mg via INTRAVENOUS
  Filled 2024-12-16: qty 5

## 2024-12-16 MED ORDER — METOPROLOL TARTRATE 5 MG/5ML IV SOLN
2.5000 mg | Freq: Four times a day (QID) | INTRAVENOUS | Status: AC | PRN
  Filled 2024-12-16: qty 5

## 2024-12-16 MED ORDER — HEPARIN (PORCINE) 25000 UT/250ML-% IV SOLN
1200.0000 [IU]/h | INTRAVENOUS | Status: AC
  Administered 2024-12-16: 1200 [IU]/h via INTRAVENOUS
  Filled 2024-12-16: qty 250

## 2024-12-16 NOTE — Progress Notes (Signed)
 Heart Failure Navigator Progress Note  Assessed for Heart & Vascular TOC clinic readiness.  Patient does not meet criteria due to current [facility] Cardiology patient.   Navigator will sign off at this time.  Roxy Horseman, RN, BSN [facility] Heart Failure Navigator Secure Chat Only

## 2024-12-16 NOTE — Plan of Care (Signed)
?  Problem: Nutrition: Goal: Adequate nutrition will be maintained Outcome: Progressing   Problem: Elimination: Goal: Will not experience complications related to urinary retention Outcome: Progressing   Problem: Safety: Goal: Ability to remain free from injury will improve Outcome: Progressing   

## 2024-12-16 NOTE — Consult Note (Signed)
 PHARMACY - ANTICOAGULATION CONSULT NOTE  Pharmacy Consult for Heparin  infusion - transition to Apixaban  Indication: atrial fibrillation  Allergies[1]  Patient Measurements: Height: 5' 3 (160 cm) Weight: 110.6 kg (243 lb 13.3 oz) IBW/kg (Calculated) : 56.9 HEPARIN  DW (KG): 83  Vital Signs: Temp: 97.6 F (36.4 C) (02/02 0430) Temp Source: Oral (02/01 2344) BP: 126/92 (02/02 1057) Pulse Rate: 92 (02/02 1057)  Labs: Recent Labs    12/15/24 1434 12/16/24 0414 12/16/24 0942  HGB 14.0  13.9 13.2  --   HCT 43.5  42.2 40.3  --   PLT 138*  131* 130*  --   APTT  --   --  25  LABPROT  --   --  14.2  INR  --   --  1.0  CREATININE 0.87 0.81  --     Estimated Creatinine Clearance: 90.1 mL/min (by C-G formula based on SCr of 0.81 mg/dL).   Medical History: No past medical history on file.  Medications:  No AC prior to admission per med list and medrec  Assessment: 74yo male with unremarkable medical history present this admission with shortness of breath and distended abdomen. Noted new onset HF and Afib. Per cardiology recommendation metoprolol  tartrate started. ECHO pending. Pharmacy consulted to transition for heparin  gtt to apixaban  for Afib.  Noted PLT at 130 baseline, H/H within nromal limits. Baseline aPTT an INR ordered.  Goal of Therapy:  Heparin  level 0.3-0.7 units/ml Monitor platelets by anticoagulation protocol: Yes   Plan:  Continue heparin  at 1200 units/hr until 1759, then dc infusion Start Eliquis  5mg  po BID (1st dose 2/2 @ 2000) Continue to monitor H&H and platelets  Walden Statz Rodriguez-Guzman PharmD, BCPS 12/16/2024 11:41 AM       [1] No Known Allergies

## 2024-12-17 ENCOUNTER — Other Ambulatory Visit (HOSPITAL_COMMUNITY): Payer: Self-pay

## 2024-12-17 ENCOUNTER — Telehealth: Payer: Self-pay | Admitting: Pharmacist

## 2024-12-17 ENCOUNTER — Telehealth (HOSPITAL_COMMUNITY): Payer: Self-pay

## 2024-12-17 LAB — GLUCOSE, CAPILLARY: Glucose-Capillary: 94 mg/dL (ref 70–99)

## 2024-12-17 MED ORDER — AMIODARONE HCL 200 MG PO TABS
400.0000 mg | ORAL_TABLET | Freq: Two times a day (BID) | ORAL | Status: AC
  Administered 2024-12-17 – 2024-12-20 (×8): 400 mg via ORAL
  Filled 2024-12-17 (×8): qty 2

## 2024-12-17 MED ORDER — DAPAGLIFLOZIN PROPANEDIOL 5 MG PO TABS
10.0000 mg | ORAL_TABLET | Freq: Every day | ORAL | Status: AC
  Administered 2024-12-17 – 2024-12-20 (×4): 10 mg via ORAL
  Filled 2024-12-17 (×2): qty 1
  Filled 2024-12-17 (×2): qty 2
  Filled 2024-12-17: qty 1

## 2024-12-17 MED ORDER — AMIODARONE HCL 200 MG PO TABS: 200.0000 mg | ORAL_TABLET | Freq: Every day | ORAL | Status: AC

## 2024-12-17 MED ORDER — METOPROLOL TARTRATE 5 MG/5ML IV SOLN
5.0000 mg | INTRAVENOUS | Status: AC | PRN
  Administered 2024-12-17 – 2024-12-18 (×2): 5 mg via INTRAVENOUS
  Filled 2024-12-17 (×2): qty 5

## 2024-12-17 MED ORDER — POLYETHYLENE GLYCOL 3350 17 G PO PACK
17.0000 g | PACK | Freq: Every day | ORAL | Status: AC
  Administered 2024-12-17 – 2024-12-20 (×4): 17 g via ORAL
  Filled 2024-12-17 (×4): qty 1

## 2024-12-17 MED ORDER — METOPROLOL TARTRATE 25 MG PO TABS
37.5000 mg | ORAL_TABLET | Freq: Three times a day (TID) | ORAL | Status: DC
  Administered 2024-12-17 (×2): 37.5 mg via ORAL
  Filled 2024-12-17 (×3): qty 2

## 2024-12-17 NOTE — Telephone Encounter (Signed)
 Pharmacy Patient Advocate Encounter  Insurance verification completed.    The patient is insured through [employer]. Patient has Medicare and is not eligible for a copay card, but may be able to apply for patient assistance or Medicare RX Payment Plan (Patient Must reach out to their plan, if eligible for payment plan), if available.    Ran test claim for Jardiance 10mg  tablet and the current 30 day co-pay is $12.65  Ran test claim for Farxiga  10mg  tablet and the current 30 day co-pay is $12.65  This test claim was processed through [employer]- copay amounts may vary at other pharmacies due to [employer], or as the patient moves through the different stages of their insurance plan.

## 2024-12-17 NOTE — Plan of Care (Signed)

## 2024-12-17 NOTE — Discharge Instructions (Signed)

## 2024-12-18 ENCOUNTER — Telehealth: Payer: Self-pay | Admitting: Pharmacist

## 2024-12-18 LAB — CBC
HCT: 43.2 % (ref 39.0–52.0)
Hemoglobin: 13.9 g/dL (ref 13.0–17.0)
MCH: 32.5 pg (ref 26.0–34.0)
MCHC: 32.2 g/dL (ref 30.0–36.0)
MCV: 100.9 fL — ABNORMAL HIGH (ref 80.0–100.0)
Platelets: 138 10*3/uL — ABNORMAL LOW (ref 150–400)
RBC: 4.28 MIL/uL (ref 4.22–5.81)
RDW: 13.6 % (ref 11.5–15.5)
WBC: 7.7 10*3/uL (ref 4.0–10.5)
nRBC: 0 % (ref 0.0–0.2)

## 2024-12-18 LAB — DRUG SCREEN 764883 11+OXYCO+ALC+CRT-BUND
Amphetamines, Urine: NEGATIVE ng/mL
BENZODIAZ UR QL: NEGATIVE ng/mL
Barbiturate screen, urine: NEGATIVE ng/mL
Cannabinoid Quant, Ur: NEGATIVE ng/mL
Cocaine (Metab.): NEGATIVE ng/mL
Creatinine, Urine: 19.8 mg/dL — ABNORMAL LOW (ref 20.0–300.0)
Ethanol U, Quan: NEGATIVE %
Meperidine: NEGATIVE ng/mL
Methadone Screen, Urine: NEGATIVE ng/mL
Nitrite Urine, Quantitative: NEGATIVE ug/mL
OPIATE SCREEN URINE: NEGATIVE ng/mL
Oxycodone/Oxymorphone, Urine: NEGATIVE ng/mL
Phencyclidine, Ur: NEGATIVE ng/mL
Propoxyphene, Urine: NEGATIVE ng/mL
Tramadol Ur: NEGATIVE ng/mL
pH, Urine: 4.8 (ref 4.5–8.9)

## 2024-12-18 LAB — BASIC METABOLIC PANEL WITH GFR
Anion gap: 11 (ref 5–15)
BUN: 26 mg/dL — ABNORMAL HIGH (ref 8–23)
CO2: 27 mmol/L (ref 22–32)
Calcium: 9.8 mg/dL (ref 8.9–10.3)
Chloride: 103 mmol/L (ref 98–111)
Creatinine, Ser: 1.01 mg/dL (ref 0.61–1.24)
GFR, Estimated: >60 mL/min
Glucose, Bld: 98 mg/dL (ref 70–99)
Potassium: 4.5 mmol/L (ref 3.5–5.1)
Sodium: 141 mmol/L (ref 135–145)

## 2024-12-18 LAB — SPECIFIC GRAVITY: Specific Gravity: 1.0238

## 2024-12-18 MED ORDER — LACTULOSE 10 GM/15ML PO SOLN
20.0000 g | Freq: Two times a day (BID) | ORAL | Status: AC
  Administered 2024-12-18 – 2024-12-20 (×5): 20 g via ORAL
  Filled 2024-12-18 (×5): qty 30

## 2024-12-18 MED ORDER — SPIRONOLACTONE 12.5 MG HALF TABLET
12.5000 mg | ORAL_TABLET | Freq: Every day | ORAL | Status: DC
  Administered 2024-12-18: 12.5 mg via ORAL
  Filled 2024-12-18 (×2): qty 1

## 2024-12-18 MED ORDER — METOPROLOL TARTRATE 50 MG PO TABS
50.0000 mg | ORAL_TABLET | Freq: Two times a day (BID) | ORAL | Status: DC
  Administered 2024-12-18 – 2024-12-19 (×3): 50 mg via ORAL
  Filled 2024-12-18 (×3): qty 1

## 2024-12-18 MED ORDER — DM-GUAIFENESIN ER 30-600 MG PO TB12
1.0000 | ORAL_TABLET | Freq: Two times a day (BID) | ORAL | Status: AC
  Administered 2024-12-18 – 2024-12-20 (×6): 1 via ORAL
  Filled 2024-12-18 (×6): qty 1

## 2024-12-18 NOTE — Progress Notes (Signed)
 " PROGRESS NOTE    Joseph Montoya  FMW:[id number] DOB: May 19, 1951 DOA: 12/15/2024 PCP: [facility]   Brief Narrative:  74 y.o. year old male without significant medical history presenting to the ED with worsening shortness of breath, chest pain with radiation to the back for 1 week.  He was noted to be in A-fib with RVR so he was given Cardizem  intravenous push.  Chest x-ray with cardiomegaly and concern for vascular congestion.  He had mildly elevated troponin high.  Cardiology was consulted.  Admitted for management of new onset A-fib with RVR as well as possible congestive heart failure, needing cardiology consultation.  Needs home O2 evaluation bvefore discharge, Lives at home with his family.   Assessment & Plan:  Principal Problem:   New onset of congestive heart failure (HCC) Active Problems:   Atrial fibrillation with RVR (HCC)   Thrombocytopenia   Elevated LFTs   Essential hypertension    A-fib with RVR, POA: New onset.  Denies any prior history of A-fib.    Initially started on oral metoprolol  tartrate 25 mg p.o. 3 times daily along with heparin  drip. TTE with LVEF 50 to 55%  Continue amiodarone , metoprolol , and eliquis    New onset acute diastolic congestive heart failure, POA: Chest x-ray showed evidence of pulmonary edema.  Patient does have elevated proBNP.  Continue intravenous furosemide  40 mg twice daily with close monitoring of renal function and electrolytes.  Transthoracic echocardiogram LVEF 50-55%.  Strict intake output monitoring, fluid restriction of 1.5 L/day.  Cardiology consulted, appreciate assistance. Continue with farxiga  10 mg PO Daily, aldactone   Acute hypoxic respiratory failure,POA: Actually on 4 L of oxygen, now down to 2 L -in the setting of new onset CHF and pneumonia -Goal oxygen saturation between 88-92%, wean as able   Left upper lobe possible community-acquired bacterial pneumonia, POA: Seen on CT angio chest.   Continue with IV ceftriaxone  and doxycycline  for total of 3 days and then oral Augmentin  875 mg twice daily for 2 days. Negative respiratory panel by PCR  Thrombocytopenia, POA: Unclear etiology.  Continue to monitor closely.  Elevated ALT, POA: Right upper quadrant was done which showed evidence of hepatic steatosis.  Continue to monitor closely.  INR is within normal limits.  Essential hypertension, POA: Started on oral metoprolol  tartrate 25 mg thrice daily  Class III obesity, POA: BMI 43.1.  Patient is likely candidate for GLP-1 receptor agonist therapy as an outpatient.  Disposition: Home  DVT prophylaxis: SCDs Start: 12/15/24 1808 apixaban  (ELIQUIS ) tablet 5 mg     Code Status: Full Code Family Communication: Daughter is at bedside Status is: Inpatient Remains inpatient appropriate because: New onset A-fib with RVR, possible congestive heart failure   Subjective:  Feels breathing is improved. Having some RVR this AM. Noted that he has some chest congestion.   Examination:  Constitutional: In no distress.  Cardiovascular: irregularly irregular, tachycardic No lower extremity edema  Pulmonary: Non labored breathing on room air, no wheezing or rales.   Abdominal: Soft. Non distended and non tender Musculoskeletal: Normal range of motion.     Neurological: Alert and oriented to person, place, and time. Non focal  Skin: Skin is warm and dry.      Diet Orders (From admission, onward)     Start     Ordered   12/16/24 1128  Diet Heart Room service appropriate? Yes; Fluid consistency: Thin; Fluid restriction: 1500 mL Fluid  Diet effective now  Question Answer Comment  Room service appropriate? Yes   Fluid consistency: Thin   Fluid restriction: 1500 mL Fluid      12/16/24 1127            Objective: Vitals:   12/18/24 0300 12/18/24 0800 12/18/24 1033 12/18/24 1557  BP: 109/68  138/82 122/79  Pulse: 73  (!) 139   Resp: 20     Temp: 98 F (36.7 C) 97.9 F  (36.6 C)  98.2 F (36.8 C)  TempSrc: Oral   Oral  SpO2: 99%     Weight:      Height:        Intake/Output Summary (Last 24 hours) at 12/18/2024 1619 Last data filed at 12/18/2024 1446 Gross per 24 hour  Intake 1816.35 ml  Output 2480 ml  Net -663.65 ml   Filed Weights   12/15/24 1426 12/15/24 2344 12/17/24 0500  Weight: 88 kg 110.6 kg 100.1 kg    Scheduled Meds:  amiodarone   400 mg Oral BID   Followed by   NOREEN ON 12/27/2024] amiodarone   200 mg Oral Daily   amoxicillin -clavulanate  1 tablet Oral Q12H   apixaban   5 mg Oral BID   dapagliflozin  propanediol  10 mg Oral Daily   dextromethorphan -guaiFENesin   1 tablet Oral BID   furosemide   40 mg Intravenous BID   lactulose   20 g Oral BID   metoprolol  tartrate  50 mg Oral BID   polyethylene glycol  17 g Oral Daily   sodium chloride  flush  3 mL Intravenous Q12H   spironolactone   12.5 mg Oral Daily   Continuous Infusions:    Nutritional status     Body mass index is 39.09 kg/m.  Data Reviewed:   CBC: Recent Labs  Lab 12/15/24 1434 12/16/24 0414 12/18/24 0400  WBC 7.0  7.0 7.6 7.7  NEUTROABS 4.6  --   --   HGB 14.0  13.9 13.2 13.9  HCT 43.5  42.2 40.3 43.2  MCV 101.2*  99.5 100.2* 100.9*  PLT 138*  131* 130* 138*   Basic Metabolic Panel: Recent Labs  Lab 12/15/24 1434 12/15/24 1808 12/16/24 0414 12/18/24 0400  NA 142  --  142 141  K 3.9  --  4.2 4.5  CL 108  --  106 103  CO2 25  --  25 27  GLUCOSE 126*  --  108* 98  BUN 14  --  15 26*  CREATININE 0.87  --  0.81 1.01  CALCIUM 9.3  --  9.0 9.8  MG  --  2.1  --   --    GFR: Estimated Creatinine Clearance: 68.4 mL/min (by C-G formula based on SCr of 1.01 mg/dL). Liver Function Tests: Recent Labs  Lab 12/15/24 1434  AST 40  ALT 79*  ALKPHOS 115  BILITOT 0.7  PROT 7.1  ALBUMIN 4.3   No results for input(s): LIPASE, AMYLASE in the last 168 hours. No results for input(s): AMMONIA in the last 168 hours. Coagulation Profile: Recent  Labs  Lab 12/16/24 0942  INR 1.0   Cardiac Enzymes: No results for input(s): CKTOTAL, CKMB, CKMBINDEX, TROPONINI in the last 168 hours. BNP (last 3 results) Recent Labs    12/15/24 1434  PROBNP 1,984.0*   HbA1C: No results for input(s): HGBA1C in the last 72 hours.  CBG: Recent Labs  Lab 12/16/24 1019 12/17/24 0831  GLUCAP 103* 94   Lipid Profile: No results for input(s): CHOL, HDL, LDLCALC, TRIG, CHOLHDL, LDLDIRECT in the last  72 hours.  Thyroid Function Tests: Recent Labs    12/15/24 1808  TSH 2.800   Anemia Panel: No results for input(s): VITAMINB12, FOLATE, FERRITIN, TIBC, IRON, RETICCTPCT in the last 72 hours. Sepsis Labs: No results for input(s): PROCALCITON, LATICACIDVEN in the last 168 hours.  Recent Results (from the past 240 hours)  Respiratory (~20 pathogens) panel by PCR     Status: None   Collection Time: 12/16/24 10:52 AM   Specimen: Nasopharyngeal Swab; Respiratory  Result Value Ref Range Status   Adenovirus NOT DETECTED NOT DETECTED Final   Coronavirus 229E NOT DETECTED NOT DETECTED Final    Comment: (NOTE) The Coronavirus on the Respiratory Panel, DOES NOT test for the novel  Coronavirus (2019 nCoV)    Coronavirus HKU1 NOT DETECTED NOT DETECTED Final   Coronavirus NL63 NOT DETECTED NOT DETECTED Final   Coronavirus OC43 NOT DETECTED NOT DETECTED Final   Metapneumovirus NOT DETECTED NOT DETECTED Final   Rhinovirus / Enterovirus NOT DETECTED NOT DETECTED Final   Influenza A NOT DETECTED NOT DETECTED Final   Influenza B NOT DETECTED NOT DETECTED Final   Parainfluenza Virus 1 NOT DETECTED NOT DETECTED Final   Parainfluenza Virus 2 NOT DETECTED NOT DETECTED Final   Parainfluenza Virus 3 NOT DETECTED NOT DETECTED Final   Parainfluenza Virus 4 NOT DETECTED NOT DETECTED Final   Respiratory Syncytial Virus NOT DETECTED NOT DETECTED Final   Bordetella pertussis NOT DETECTED NOT DETECTED Final   Bordetella  Parapertussis NOT DETECTED NOT DETECTED Final   Chlamydophila pneumoniae NOT DETECTED NOT DETECTED Final   Mycoplasma pneumoniae NOT DETECTED NOT DETECTED Final    Comment: Performed at [facility], [address], [place], [place] [address]         Radiology Studies: No results found.    LOS: 3 days   Time spent= 35 mins   Alban Pepper, MD [facility]  If 7PM-7AM, please contact night-coverage  12/18/2024, 4:19 PM  "

## 2024-12-18 NOTE — Progress Notes (Cosign Needed)
 [facility] CARDIOLOGY PROGRESS NOTE   Patient ID: Joseph Montoya MRN: [id number] DOB/AGE: 74-Nov-1952 74 y.o.  Admit date: 12/15/2024 Referring Physician Dr Morene Bathe  Primary [facility]  Primary Cardiologist None Reason for Consultation heart failure, AF  HPI: Joseph Montoya is a 74 y.o. male with no significant past medical who presented to the ED on 12/15/2024 for SOB, CP. Cardiology was consulted for further evaluation.   Interval History:  -Patient seen and examined this AM, resting in bed with family at bedside.  -Reports SOB is somewhat improved. He reports constipation and has not had a BM since this past weekend. -BP and HR stable.   Review of systems complete and found to be negative unless listed above   Vitals:   12/17/24 2200 12/17/24 2325 12/18/24 0300 12/18/24 0800  BP: (!) 150/94 (!) 124/93 109/68   Pulse: (!) 103 69 73   Resp: (!) 24 16 20    Temp:  97.6 F (36.4 C) 98 F (36.7 C) 97.9 F (36.6 C)  TempSrc:   Oral   SpO2: 100% 96% 99%   Weight:      Height:         Intake/Output Summary (Last 24 hours) at 12/18/2024 0940 Last data filed at 12/18/2024 0920 Gross per 24 hour  Intake 1696.35 ml  Output 1350 ml  Net 346.35 ml     PHYSICAL EXAM General: Chronically ill appearing male, well nourished, in no acute distress. HEENT: Normocephalic and atraumatic. Neck: No JVD.  Lungs: Normal respiratory effort on 2L [place]. Clear bilaterally to auscultation. No wheezes, crackles, rhonchi.  Heart: Irregularly irregular, improved rate. Normal S1 and S2 without gallops or murmurs. Radial & DP pulses 2+ bilaterally. Abdomen: Non-distended appearing.  Msk: Normal strength and tone for age. Extremities: No clubbing, cyanosis or edema.   Neuro: Alert and oriented X 3. Psych: Mood appropriate, affect congruent.    LABS: Basic Metabolic Panel: Recent Labs    12/15/24 1808 12/16/24 0414 12/18/24 0400  NA  --  142 141  K  --   4.2 4.5  CL  --  106 103  CO2  --  25 27  GLUCOSE  --  108* 98  BUN  --  15 26*  CREATININE  --  0.81 1.01  CALCIUM  --  9.0 9.8  MG 2.1  --   --    Liver Function Tests: Recent Labs    12/15/24 1434  AST 40  ALT 79*  ALKPHOS 115  BILITOT 0.7  PROT 7.1  ALBUMIN 4.3   No results for input(s): LIPASE, AMYLASE in the last 72 hours. CBC: Recent Labs    12/15/24 1434 12/16/24 0414 12/18/24 0400  WBC 7.0  7.0 7.6 7.7  NEUTROABS 4.6  --   --   HGB 14.0  13.9 13.2 13.9  HCT 43.5  42.2 40.3 43.2  MCV 101.2*  99.5 100.2* 100.9*  PLT 138*  131* 130* 138*   Cardiac Enzymes: No results for input(s): CKTOTAL, CKMB, CKMBINDEX, TROPONINIHS in the last 72 hours. BNP: No results for input(s): BNP in the last 72 hours. D-Dimer: No results for input(s): DDIMER in the last 72 hours. Hemoglobin A1C: Recent Labs    12/15/24 1434  HGBA1C 6.3*   Fasting Lipid Panel: Recent Labs    12/15/24 1604  CHOL 164  HDL 34*  LDLCALC 103*  TRIG 134  CHOLHDL 4.9   Thyroid Function Tests: Recent Labs    12/15/24 1808  TSH 2.800   Anemia Panel: No results for input(s): VITAMINB12, FOLATE, FERRITIN, TIBC, IRON, RETICCTPCT in the last 72 hours.  No results found.   ECHO 12/2024: 1. Left ventricular ejection fraction, by estimation, is 50 to 55%. The left ventricle has low normal function. The left ventricle demonstrates regional wall motion abnormalities (see scoring diagram/findings for description). Left ventricular diastolic  function could not be evaluated.   2. Right ventricular systolic function is normal. The right ventricular  size is normal.   3. Left atrial size was moderately dilated.   4. Right atrial size was mild to moderately dilated.   5. The mitral valve was not well visualized. Moderate mitral valve  regurgitation.   6. The aortic valve was not well visualized. Aortic valve regurgitation is mild to moderate. Aortic valve sclerosis is  present, with no evidence of aortic valve stenosis.   7. Aortic dilatation noted. There is moderate dilatation of the aortic  root, measuring 45 mm. There is mild dilatation of the ascending aorta,  measuring 40 mm.   TELEMETRY (personally reviewed): atrial fibrillation rate 90-100s  EKG (personally reviewed): AF RVR rate 144 bpm  DATA reviewed by me 12/18/24: last 24h vitals tele labs imaging I/O, hospitalist progress note  Principal Problem:   New onset of congestive heart failure (HCC) Active Problems:   Atrial fibrillation with RVR (HCC)   Thrombocytopenia   Elevated LFTs   Essential hypertension    ASSESSMENT AND PLAN: Joseph Montoya is a 74 y.o. male with no significant past medical who presented to the ED on 12/15/2024 for SOB, CP. Cardiology was consulted for further evaluation.   # Acute heart failure preserved EF # Atrial fibrillation RVR # Pneumonia # Hypertension Patient presented with SOB, suspected heart failure exacerbation as well as possible pneumonia. Also noted to be in AF RVR, rates now improved. Echo this admission with EF 50-55%, moderate MR, mild to moderate AR. -Continue IV lasix  40 mg twice daily.  -Start spironolactone  12.5 mg daily. Continue farxiga  10 mg daily.  -Consolidate metoprolol  tartrate to 50 mg twice daily.  -Continue PO amiodarone  load as ordered.  -Continue eliquis  5 mg twice daily.   This patient's case was discussed and created with Dr. Florencio and he is in agreement.  Signed:  Danita Bloch, PA-C  12/18/2024, 9:40 AM [facility]

## 2024-12-18 NOTE — Plan of Care (Signed)

## 2024-12-18 NOTE — Telephone Encounter (Signed)
 Heart Failure Stewardship Pharmacy Note  PCP: [facility] PCP-Cardiologist: None  HPI: Joseph Montoya is a 74 y.o. male with no noted PMH who presented with shortness of breath and chest pain. On admission, proBNP was 1984, HS-troponin was 24, ALT was 79, and A1c was 6.3. Chest x-ray noted pulmonary vascular congestion with possible edema or infiltrate. TTE noted LVEF of 50-55%, moderate MR, mild to moderate AR, aortic dilation present.   Pertinent Lab Values: Creatinine, Ser  Date Value Ref Range Status  12/18/2024 1.01 0.61 - 1.24 mg/dL Final   BUN  Date Value Ref Range Status  12/18/2024 26 (H) 8 - 23 mg/dL Final   Potassium  Date Value Ref Range Status  12/18/2024 4.5 3.5 - 5.1 mmol/L Final   Sodium  Date Value Ref Range Status  12/18/2024 141 135 - 145 mmol/L Final   Magnesium  Date Value Ref Range Status  12/15/2024 2.1 1.7 - 2.4 mg/dL Final    Comment:    Performed at [facility], [address] [place], [place] [address]   Hgb A1c MFr Bld  Date Value Ref Range Status  12/15/2024 6.3 (H) 4.8 - 5.6 % Final    Comment:    (NOTE) Diagnosis of Diabetes The following HbA1c ranges recommended by the American Diabetes Association (ADA) may be used as an aid in the diagnosis of diabetes mellitus.  Hemoglobin             Suggested A1C NGSP%              Diagnosis  <5.7                   Non Diabetic  5.7-6.4                Pre-Diabetic  >6.4                   Diabetic  <7.0                   Glycemic control for                       adults with diabetes.     TSH  Date Value Ref Range Status  12/15/2024 2.800 0.350 - 4.500 uIU/mL Final    Comment:    Performed at [facility], [address] [place], [place] [address]   Current Heart Failure Medications:  Loop diuretic: furosemide  40 mg IV BID Beta-Blocker: metoprolol  tartrate 37.5 mg TID ACEI/ARB/ARNI: none MRA: spironolactone  12.5 mg daily SGLT2i:  Farxiga  10 mg daily Other: none  Prior to admission Heart Failure Medications:  none  Assessment: 1. 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 NYHA class II-III symptoms.  -Symptoms: Reports feeling improved since admission. Remains short of breath when moving. Reports improving abdominal distention and LEE. Appetite is modest.  -Volume: Appears close to euvolemic today, though may have a significant abdominal reservoir. Currently on furosmeide 40 mg IV BID. BMET today noted stable creatinine and BUN trending up. May be able to convert to oral diuretics tomorrow. -Hemodynamics: BP stable. HR 70-90s in AF. -SGLT2i: Agree with starting Farxiga  10 mg daily. -MRA: Agree with adding spironolactone . Will follow K and creatinine to consider increasing to 25 mg daily tomorrow. -ARNI: Would optimize MRA prior to initiation of Entresto, given BP is relatively normal and there is greater benefit to MRA in HFpEF.  Plan:  1) Medication changes recommended at this time:  -Agree with adding spironolacotne 25 mg daily  2) Patient assistance: -Farxiga  and Jardiance are each $12.65  3) Education: - Patient has been educated on current HF medications and potential additions to HF medication regimen - Patient verbalizes understanding that over the next few months, these medication doses may change and more medications may be added to optimize HF regimen - Patient has been educated on basic disease state pathophysiology and goals of therapy  Please do not hesitate to reach out with questions or concerns,  Jaun Bash, PharmD, CPP, BCPS, [facility] Heart Failure Pharmacist  Phone - [phone] 12/18/2024 7:39 AM

## 2024-12-19 ENCOUNTER — Telehealth: Payer: Self-pay | Admitting: Pharmacist

## 2024-12-19 LAB — CBC
HCT: 45.3 % (ref 39.0–52.0)
Hemoglobin: 14.5 g/dL (ref 13.0–17.0)
MCH: 32.2 pg (ref 26.0–34.0)
MCHC: 32 g/dL (ref 30.0–36.0)
MCV: 100.7 fL — ABNORMAL HIGH (ref 80.0–100.0)
Platelets: 148 10*3/uL — ABNORMAL LOW (ref 150–400)
RBC: 4.5 MIL/uL (ref 4.22–5.81)
RDW: 13.5 % (ref 11.5–15.5)
WBC: 7 10*3/uL (ref 4.0–10.5)
nRBC: 0 % (ref 0.0–0.2)

## 2024-12-19 LAB — COMPREHENSIVE METABOLIC PANEL WITH GFR
ALT: 59 U/L — ABNORMAL HIGH (ref 0–44)
AST: 35 U/L (ref 15–41)
Albumin: 4.3 g/dL (ref 3.5–5.0)
Alkaline Phosphatase: 115 U/L (ref 38–126)
Anion gap: 12 (ref 5–15)
BUN: 22 mg/dL (ref 8–23)
CO2: 26 mmol/L (ref 22–32)
Calcium: 9.8 mg/dL (ref 8.9–10.3)
Chloride: 102 mmol/L (ref 98–111)
Creatinine, Ser: 0.96 mg/dL (ref 0.61–1.24)
GFR, Estimated: >60 mL/min
Glucose, Bld: 101 mg/dL — ABNORMAL HIGH (ref 70–99)
Potassium: 4.5 mmol/L (ref 3.5–5.1)
Sodium: 140 mmol/L (ref 135–145)
Total Bilirubin: 0.9 mg/dL (ref 0.0–1.2)
Total Protein: 7.2 g/dL (ref 6.5–8.1)

## 2024-12-19 LAB — GLUCOSE, CAPILLARY: Glucose-Capillary: 108 mg/dL — ABNORMAL HIGH (ref 70–99)

## 2024-12-19 LAB — PROCALCITONIN: Procalcitonin: 0.11 ng/mL

## 2024-12-19 LAB — MAGNESIUM: Magnesium: 2.5 mg/dL — ABNORMAL HIGH (ref 1.7–2.4)

## 2024-12-19 MED ORDER — DIGOXIN 0.25 MG/ML IJ SOLN
0.5000 mg | Freq: Once | INTRAMUSCULAR | Status: AC
  Administered 2024-12-19: 0.5 mg via INTRAVENOUS
  Filled 2024-12-19: qty 2

## 2024-12-19 MED ORDER — DIGOXIN 0.25 MG/ML IJ SOLN: 0.2500 mg | Freq: Once | INTRAMUSCULAR | Status: DC

## 2024-12-19 MED ORDER — DIGOXIN 0.25 MG/ML IJ SOLN
0.2500 mg | Freq: Once | INTRAMUSCULAR | Status: AC
  Administered 2024-12-19: 0.25 mg via INTRAVENOUS
  Filled 2024-12-19: qty 2

## 2024-12-19 MED ORDER — PANTOPRAZOLE SODIUM 40 MG PO TBEC
40.0000 mg | DELAYED_RELEASE_TABLET | Freq: Every day | ORAL | Status: AC
  Administered 2024-12-20: 40 mg via ORAL
  Filled 2024-12-19: qty 1

## 2024-12-19 MED ORDER — METOPROLOL TARTRATE 50 MG PO TABS
75.0000 mg | ORAL_TABLET | Freq: Two times a day (BID) | ORAL | Status: DC
  Administered 2024-12-19: 75 mg via ORAL
  Filled 2024-12-19: qty 1

## 2024-12-19 MED ORDER — SPIRONOLACTONE 25 MG PO TABS
25.0000 mg | ORAL_TABLET | Freq: Every day | ORAL | Status: AC
  Administered 2024-12-19 – 2024-12-20 (×2): 25 mg via ORAL
  Filled 2024-12-19 (×2): qty 1

## 2024-12-19 NOTE — Plan of Care (Signed)
  Problem: Clinical Measurements: Goal: Respiratory complications will improve Outcome: Progressing   Problem: Activity: Goal: Risk for activity intolerance will decrease Outcome: Progressing   Problem: Pain Managment: Goal: General experience of comfort will improve and/or be controlled Outcome: Progressing   Problem: Safety: Goal: Ability to remain free from injury will improve Outcome: Progressing

## 2024-12-19 NOTE — Progress Notes (Signed)
 O2 walk test   Pt walked in room and down the hallway. O2 lowest was 92% RA. Pt did not require any oxygen during this walk and ambulated w/ a front wheel walker in place.

## 2024-12-19 NOTE — Plan of Care (Signed)

## 2024-12-19 NOTE — Telephone Encounter (Signed)
 Heart Failure Stewardship Pharmacy Note  PCP: [facility] PCP-Cardiologist: None  HPI: Joseph Montoya is a 74 y.o. male with no noted PMH who presented with shortness of breath and chest pain. On admission, proBNP was 1984, HS-troponin was 24, ALT was 79, and A1c was 6.3. Chest x-ray noted pulmonary vascular congestion with possible edema or infiltrate. TTE noted LVEF of 50-55%, moderate MR, mild to moderate AR, aortic dilation present.   Pertinent Lab Values: Creatinine, Ser  Date Value Ref Range Status  12/19/2024 0.96 0.61 - 1.24 mg/dL Final   BUN  Date Value Ref Range Status  12/19/2024 22 8 - 23 mg/dL Final   Potassium  Date Value Ref Range Status  12/19/2024 4.5 3.5 - 5.1 mmol/L Final   Sodium  Date Value Ref Range Status  12/19/2024 140 135 - 145 mmol/L Final   Magnesium  Date Value Ref Range Status  12/19/2024 2.5 (H) 1.7 - 2.4 mg/dL Final    Comment:    Performed at [facility], [address] [place], [place] [address]   Hgb A1c MFr Bld  Date Value Ref Range Status  12/15/2024 6.3 (H) 4.8 - 5.6 % Final    Comment:    (NOTE) Diagnosis of Diabetes The following HbA1c ranges recommended by the American Diabetes Association (ADA) may be used as an aid in the diagnosis of diabetes mellitus.  Hemoglobin             Suggested A1C NGSP%              Diagnosis  <5.7                   Non Diabetic  5.7-6.4                Pre-Diabetic  >6.4                   Diabetic  <7.0                   Glycemic control for                       adults with diabetes.     TSH  Date Value Ref Range Status  12/15/2024 2.800 0.350 - 4.500 uIU/mL Final    Comment:    Performed at [facility], [address] [place], [place] [address]   Current Heart Failure Medications:  Loop diuretic: furosemide  40 mg IV BID Beta-Blocker: metoprolol  tartrate 75 mg BID ACEI/ARB/ARNI: none MRA: spironolactone  25 mg daily SGLT2i: Farxiga   10 mg daily Other: none  Prior to admission Heart Failure Medications:  none  Assessment: 1. 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 NYHA class II-III symptoms.  -Symptoms: Reports feeling improved since admission. Shortness of breath continues to improve, but remains present. Reports improving abdominal distention and LEE, but still has not had a bowel movement. Appetite is modest.  -Volume: Appears close to euvolemic today, though may have a significant abdominal reservoir. Currently on furosmeide 40 mg IV BID. BMET today noted stable creatinine and BUN.   -Hemodynamics: BP elevated. HR 70-90s in AF. -SGLT2i: Continue Farxiga  10 mg daily. -MRA: Agree with increasing spironolactone  to 25 mg daily. -ARNI: Could consider adding Entresto for modest benefit to HF symptoms and hospitalizations if BP remains elevated.  Plan: 1) Medication changes recommended at this time:  -Agree with increasing spironolacotne to 25 mg  daily  2) Patient assistance: -Farxiga  and Jardiance are each $12.65  3) Education: - Patient has been educated on current HF medications and potential additions to HF medication regimen - Patient verbalizes understanding that over the next few months, these medication doses may change and more medications may be added to optimize HF regimen - Patient has been educated on basic disease state pathophysiology and goals of therapy  Please do not hesitate to reach out with questions or concerns,  Joseph Montoya, PharmD, CPP, BCPS, [facility] Heart Failure Pharmacist  Phone - [phone] 12/19/2024 8:16 AM

## 2024-12-19 NOTE — Progress Notes (Cosign Needed)
 [facility] CLINIC CARDIOLOGY PROGRESS NOTE   Patient ID: Joseph Montoya MRN: [id number] DOB/AGE: [birth year] 74 y.o.  Admit date: 12/15/2024 Referring Physician Dr Morene Bathe  Primary Physician [facility], [facility]  Primary Cardiologist None Reason for Consultation heart failure, AF  HPI: Joseph Montoya is a 74 y.o. male with no significant past medical who presented to the ED on 12/15/2024 for SOB, CP. Cardiology was consulted for further evaluation.   Interval History:  -Patient seen and examined this AM, sitting upright on side of hospital bed eating breakfast. -SOB continues to improve. Did have a BM yesterday. -BP stable. HR elevated this AM.  Review of systems complete and found to be negative unless listed above   Vitals:   12/19/24 0022 12/19/24 0352 12/19/24 0500 12/19/24 0811  BP: 125/72 120/71  126/72  Pulse: 77 81  97  Resp: 20 20  18   Temp: 97.7 F (36.5 C) 98.1 F (36.7 C)  98.6 F (37 C)  TempSrc: Oral     SpO2: 99% 97%  97%  Weight:   105 kg   Height:         Intake/Output Summary (Last 24 hours) at 12/19/2024 9047 Last data filed at 12/19/2024 9160 Gross per 24 hour  Intake 603.01 ml  Output 1980 ml  Net -1376.99 ml     PHYSICAL EXAM General: Chronically ill appearing male, well nourished, in no acute distress. HEENT: Normocephalic and atraumatic. Neck: No JVD.  Lungs: Normal respiratory effort on 2L [place]. Clear bilaterally to auscultation. No wheezes, crackles, rhonchi.  Heart: Irregularly irregular, improved rate. Normal S1 and S2 without gallops or murmurs. Radial & DP pulses 2+ bilaterally. Abdomen: Non-distended appearing.  Msk: Normal strength and tone for age. Extremities: No clubbing, cyanosis or edema.   Neuro: Alert and oriented X 3. Psych: Mood appropriate, affect congruent.    LABS: Basic Metabolic Panel: Recent Labs    12/18/24 0400 12/19/24 0440  NA 141 140  K 4.5 4.5  CL 103 102  CO2 27 26  GLUCOSE 98 101*  BUN  26* 22  CREATININE 1.01 0.96  CALCIUM 9.8 9.8  MG  --  2.5*   Liver Function Tests: Recent Labs    12/19/24 0440  AST 35  ALT 59*  ALKPHOS 115  BILITOT 0.9  PROT 7.2  ALBUMIN 4.3   No results for input(s): LIPASE, AMYLASE in the last 72 hours. CBC: Recent Labs    12/18/24 0400 12/19/24 0440  WBC 7.7 7.0  HGB 13.9 14.5  HCT 43.2 45.3  MCV 100.9* 100.7*  PLT 138* 148*   Cardiac Enzymes: No results for input(s): CKTOTAL, CKMB, CKMBINDEX, TROPONINIHS in the last 72 hours. BNP: No results for input(s): BNP in the last 72 hours. D-Dimer: No results for input(s): DDIMER in the last 72 hours. Hemoglobin A1C: No results for input(s): HGBA1C in the last 72 hours.  Fasting Lipid Panel: No results for input(s): CHOL, HDL, LDLCALC, TRIG, CHOLHDL, LDLDIRECT in the last 72 hours.  Thyroid Function Tests: No results for input(s): TSH, T4TOTAL, T3FREE, THYROIDAB in the last 72 hours.  Invalid input(s): FREET3  Anemia Panel: No results for input(s): VITAMINB12, FOLATE, FERRITIN, TIBC, IRON, RETICCTPCT in the last 72 hours.  No results found.   ECHO 12/2024: 1. Left ventricular ejection fraction, by estimation, is 50 to 55%. The left ventricle has low normal function. The left ventricle demonstrates regional wall motion abnormalities (see scoring diagram/findings for description). Left ventricular diastolic  function could not  be evaluated.   2. Right ventricular systolic function is normal. The right ventricular  size is normal.   3. Left atrial size was moderately dilated.   4. Right atrial size was mild to moderately dilated.   5. The mitral valve was not well visualized. Moderate mitral valve  regurgitation.   6. The aortic valve was not well visualized. Aortic valve regurgitation is mild to moderate. Aortic valve sclerosis is present, with no evidence of aortic valve stenosis.   7. Aortic dilatation noted. There is  moderate dilatation of the aortic  root, measuring 45 mm. There is mild dilatation of the ascending aorta,  measuring 40 mm.   TELEMETRY (personally reviewed): atrial fibrillation rate 100s but periodically up to 130s  EKG (personally reviewed): AF RVR rate 144 bpm  DATA reviewed by me 12/19/24: last 24h vitals tele labs imaging I/O, hospitalist progress note  Principal Problem:   New onset of congestive heart failure (HCC) Active Problems:   Atrial fibrillation with RVR (HCC)   Thrombocytopenia   Elevated LFTs   Essential hypertension    ASSESSMENT AND PLAN: Joseph Montoya is a 74 y.o. male with no significant past medical who presented to the ED on 12/15/2024 for SOB, CP. Cardiology was consulted for further evaluation.   # Acute heart failure preserved EF # Atrial fibrillation RVR # Pneumonia # Hypertension Patient presented with SOB, suspected heart failure exacerbation as well as possible pneumonia. Also noted to be in AF RVR, rates now improved. Echo this admission with EF 50-55%, moderate MR, mild to moderate AR. -Continue IV lasix  40 mg twice daily.  -Increase spironolactone  to 25 mg daily. Continue farxiga  10 mg daily.  -Increase metoprolol  tartrate to 100 mg twice daily.  -Continue PO amiodarone  load as ordered.  -Will give 2 doses of IV digoxin  today for rate control. Check level tomorrow as discussed with pharmacy. -Continue eliquis  5 mg twice daily.   This patient's case was discussed and created with Dr. Florencio and he is in agreement.  Signed:  Danita Bloch, PA-C  12/19/2024, 9:52 AM [facility] Cardiology

## 2024-12-19 NOTE — Progress Notes (Signed)
 " PROGRESS NOTE    Joseph Montoya  FMW:[id number] DOB: [birth year] DOA: 12/15/2024 PCP: [facility]   Brief Narrative:  74 y.o. year old male without significant medical history presenting to the ED with worsening shortness of breath, chest pain with radiation to the back for 1 week.  He was noted to be in A-fib with RVR so he was given Cardizem  intravenous push.  Chest x-ray with cardiomegaly and concern for vascular congestion.  He had mildly elevated troponin high.  Cardiology was consulted.  Admitted for management of new onset A-fib with RVR as well as possible congestive heart failure, needing cardiology consultation.  Needs home O2 evaluation bvefore discharge, Lives at home with his family.   Assessment & Plan:  Principal Problem:   New onset of congestive heart failure (HCC) Active Problems:   Atrial fibrillation with RVR (HCC)   Thrombocytopenia   Elevated LFTs   Essential hypertension    A-fib with RVR, POA: Rates remain well-controlled denies any prior history of A-fib.    Initially started on oral metoprolol  tartrate 25 mg p.o. 3 times daily along with heparin  drip. TTE with LVEF 50 to 55% Now on amiodarone , metoprolol , and Eliquis , digoxin  was given this a.m. to help improve rates   New onset acute diastolic congestive heart failure, POA: Chest x-ray showed evidence of pulmonary edema.  Patient does have elevated proBNP.  Continue intravenous furosemide  40 mg twice daily with close monitoring of renal function and electrolytes.  Transthoracic echocardiogram LVEF 50-55%.  Strict intake output monitoring, fluid restriction of 1.5 L/day.  Cardiology consulted, appreciate assistance. Continue with farxiga  10 mg PO Daily, aldactone   Acute hypoxic respiratory failure,POA: Actually on 4 L of oxygen, now down to 2 L -in the setting of new onset CHF and pneumonia -Goal oxygen saturation between 88-92%, wean as able   Left upper lobe possible  community-acquired bacterial pneumonia, POA: Seen on CT angio chest.  Continue with IV ceftriaxone  and doxycycline  for total of 3 days and then oral Augmentin  875 mg twice daily for 2 days. Negative respiratory panel by PCR  Thrombocytopenia, POA: Unclear etiology.  Continue to monitor closely.  Elevated ALT, POA: Right upper quadrant was done which showed evidence of hepatic steatosis.  Continues to improve INR is within normal limits.  Essential hypertension, POA: On Aldactone  and metoprolol   Class III obesity, POA: BMI 43.1.  Patient is likely candidate for GLP-1 receptor agonist therapy as an outpatient.  Disposition: Home  DVT prophylaxis: SCDs Start: 12/15/24 1808 apixaban  (ELIQUIS ) tablet 5 mg     Code Status: Full Code Family Communication: Daughter is at bedside Status is: Inpatient Remains inpatient appropriate because: New onset A-fib with RVR,  congestive heart failure   Subjective:  Feels breathing and chest congestion is improved. Feels abdomen is less distended after bowel movement.   Examination:  Constitutional: In no distress.  Cardiovascular: Irregularly irregular. No lower extremity edema  Pulmonary: Non labored breathing on [place], no wheezing or rales.   Abdominal: Soft. Non distended and non tender Musculoskeletal: Normal range of motion.     Neurological: Alert and oriented to person, place, and time. Non focal  Skin: Skin is warm and dry.    Diet Orders (From admission, onward)     Start     Ordered   12/16/24 1128  Diet Heart Room service appropriate? Yes; Fluid consistency: Thin; Fluid restriction: 1500 mL Fluid  Diet effective now       Question Answer Comment  Room service appropriate? Yes   Fluid consistency: Thin   Fluid restriction: 1500 mL Fluid      12/16/24 1127            Objective: Vitals:   12/19/24 0811 12/19/24 1128 12/19/24 1556 12/19/24 1649  BP: 126/72 136/61 (!) 131/54   Pulse: 97 81 77   Resp: 18 19 18    Temp: 98.6  F (37 C) 97.8 F (36.6 C) 98.4 F (36.9 C)   TempSrc:      SpO2: 97% 97% 95% 96%  Weight:      Height:        Intake/Output Summary (Last 24 hours) at 12/19/2024 1736 Last data filed at 12/19/2024 1722 Gross per 24 hour  Intake 603.01 ml  Output 1800 ml  Net -1196.99 ml   Filed Weights   12/15/24 2344 12/17/24 0500 12/19/24 0500  Weight: 110.6 kg 100.1 kg 105 kg    Scheduled Meds:  amiodarone   400 mg Oral BID   Followed by   NOREEN ON 12/27/2024] amiodarone   200 mg Oral Daily   amoxicillin -clavulanate  1 tablet Oral Q12H   apixaban   5 mg Oral BID   dapagliflozin  propanediol  10 mg Oral Daily   dextromethorphan -guaiFENesin   1 tablet Oral BID   furosemide   40 mg Intravenous BID   lactulose   20 g Oral BID   metoprolol  tartrate  75 mg Oral BID   polyethylene glycol  17 g Oral Daily   sodium chloride  flush  3 mL Intravenous Q12H   spironolactone   25 mg Oral Daily   Continuous Infusions:    Nutritional status     Body mass index is 41.01 kg/m.  Data Reviewed:   CBC: Recent Labs  Lab 12/15/24 1434 12/16/24 0414 12/18/24 0400 12/19/24 0440  WBC 7.0  7.0 7.6 7.7 7.0  NEUTROABS 4.6  --   --   --   HGB 14.0  13.9 13.2 13.9 14.5  HCT 43.5  42.2 40.3 43.2 45.3  MCV 101.2*  99.5 100.2* 100.9* 100.7*  PLT 138*  131* 130* 138* 148*   Basic Metabolic Panel: Recent Labs  Lab 12/15/24 1434 12/15/24 1808 12/16/24 0414 12/18/24 0400 12/19/24 0440  NA 142  --  142 141 140  K 3.9  --  4.2 4.5 4.5  CL 108  --  106 103 102  CO2 25  --  25 27 26   GLUCOSE 126*  --  108* 98 101*  BUN 14  --  15 26* 22  CREATININE 0.87  --  0.81 1.01 0.96  CALCIUM 9.3  --  9.0 9.8 9.8  MG  --  2.1  --   --  2.5*   GFR: Estimated Creatinine Clearance: 73.8 mL/min (by C-G formula based on SCr of 0.96 mg/dL). Liver Function Tests: Recent Labs  Lab 12/15/24 1434 12/19/24 0440  AST 40 35  ALT 79* 59*  ALKPHOS 115 115  BILITOT 0.7 0.9  PROT 7.1 7.2  ALBUMIN 4.3 4.3   No  results for input(s): LIPASE, AMYLASE in the last 168 hours. No results for input(s): AMMONIA in the last 168 hours. Coagulation Profile: Recent Labs  Lab 12/16/24 0942  INR 1.0   Cardiac Enzymes: No results for input(s): CKTOTAL, CKMB, CKMBINDEX, TROPONINI in the last 168 hours. BNP (last 3 results) Recent Labs    12/15/24 1434  PROBNP 1,984.0*   HbA1C: No results for input(s): HGBA1C in the last 72 hours.  CBG: Recent Labs  Lab  12/16/24 1019 12/17/24 0831 12/19/24 0836  GLUCAP 103* 94 108*   Lipid Profile: No results for input(s): CHOL, HDL, LDLCALC, TRIG, CHOLHDL, LDLDIRECT in the last 72 hours.  Thyroid Function Tests: No results for input(s): TSH, T4TOTAL, FREET4, T3FREE, THYROIDAB in the last 72 hours.  Anemia Panel: No results for input(s): VITAMINB12, FOLATE, FERRITIN, TIBC, IRON, RETICCTPCT in the last 72 hours. Sepsis Labs: Recent Labs  Lab 12/19/24 0440  PROCALCITON 0.11    Recent Results (from the past 240 hours)  Respiratory (~20 pathogens) panel by PCR     Status: None   Collection Time: 12/16/24 10:52 AM   Specimen: Nasopharyngeal Swab; Respiratory  Result Value Ref Range Status   Adenovirus NOT DETECTED NOT DETECTED Final   Coronavirus 229E NOT DETECTED NOT DETECTED Final    Comment: (NOTE) The Coronavirus on the Respiratory Panel, DOES NOT test for the novel  Coronavirus (2019 nCoV)    Coronavirus HKU1 NOT DETECTED NOT DETECTED Final   Coronavirus NL63 NOT DETECTED NOT DETECTED Final   Coronavirus OC43 NOT DETECTED NOT DETECTED Final   Metapneumovirus NOT DETECTED NOT DETECTED Final   Rhinovirus / Enterovirus NOT DETECTED NOT DETECTED Final   Influenza A NOT DETECTED NOT DETECTED Final   Influenza B NOT DETECTED NOT DETECTED Final   Parainfluenza Virus 1 NOT DETECTED NOT DETECTED Final   Parainfluenza Virus 2 NOT DETECTED NOT DETECTED Final   Parainfluenza Virus 3 NOT DETECTED NOT DETECTED  Final   Parainfluenza Virus 4 NOT DETECTED NOT DETECTED Final   Respiratory Syncytial Virus NOT DETECTED NOT DETECTED Final   Bordetella pertussis NOT DETECTED NOT DETECTED Final   Bordetella Parapertussis NOT DETECTED NOT DETECTED Final   Chlamydophila pneumoniae NOT DETECTED NOT DETECTED Final   Mycoplasma pneumoniae NOT DETECTED NOT DETECTED Final    Comment: Performed at [facility], [address] [place], [place] [address]    Radiology Studies: No results found.    LOS: 4 days   Time spent= 35 mins   Alban Pepper, MD [facility]  If 7PM-7AM, please contact night-coverage  12/19/2024, 5:36 PM  "

## 2024-12-20 ENCOUNTER — Inpatient Hospital Stay: Payer: MEDICAID

## 2024-12-20 ENCOUNTER — Telehealth: Payer: Self-pay | Admitting: Pharmacist

## 2024-12-20 LAB — BASIC METABOLIC PANEL WITH GFR
Anion gap: 13 (ref 5–15)
BUN: 26 mg/dL — ABNORMAL HIGH (ref 8–23)
CO2: 25 mmol/L (ref 22–32)
Calcium: 10.4 mg/dL — ABNORMAL HIGH (ref 8.9–10.3)
Chloride: 101 mmol/L (ref 98–111)
Creatinine, Ser: 1.04 mg/dL (ref 0.61–1.24)
GFR, Estimated: >60 mL/min
Glucose, Bld: 108 mg/dL — ABNORMAL HIGH (ref 70–99)
Potassium: 4.5 mmol/L (ref 3.5–5.1)
Sodium: 139 mmol/L (ref 135–145)

## 2024-12-20 LAB — CBC
HCT: 45 % (ref 39.0–52.0)
Hemoglobin: 15 g/dL (ref 13.0–17.0)
MCH: 32.7 pg (ref 26.0–34.0)
MCHC: 33.3 g/dL (ref 30.0–36.0)
MCV: 98 fL (ref 80.0–100.0)
Platelets: 154 10*3/uL (ref 150–400)
RBC: 4.59 MIL/uL (ref 4.22–5.81)
RDW: 13.2 % (ref 11.5–15.5)
WBC: 6.4 10*3/uL (ref 4.0–10.5)
nRBC: 0 % (ref 0.0–0.2)

## 2024-12-20 LAB — MAGNESIUM: Magnesium: 2.5 mg/dL — ABNORMAL HIGH (ref 1.7–2.4)

## 2024-12-20 LAB — GLUCOSE, CAPILLARY: Glucose-Capillary: 131 mg/dL — ABNORMAL HIGH (ref 70–99)

## 2024-12-20 LAB — PRO BRAIN NATRIURETIC PEPTIDE: Pro Brain Natriuretic Peptide: 1322 pg/mL — ABNORMAL HIGH

## 2024-12-20 LAB — DIGOXIN LEVEL: Digoxin Level: 1.3 ng/mL (ref 0.8–2.0)

## 2024-12-20 MED ORDER — IOHEXOL 300 MG/ML  SOLN: 100.0000 mL | Freq: Once | INTRAMUSCULAR | Status: AC | PRN

## 2024-12-20 MED ORDER — METOPROLOL TARTRATE 50 MG PO TABS
100.0000 mg | ORAL_TABLET | Freq: Two times a day (BID) | ORAL | Status: AC
  Administered 2024-12-20 (×2): 100 mg via ORAL
  Filled 2024-12-20 (×2): qty 2

## 2024-12-20 MED ORDER — MORPHINE SULFATE (PF) 2 MG/ML IV SOLN
2.0000 mg | Freq: Once | INTRAVENOUS | Status: AC
  Administered 2024-12-20: 2 mg via INTRAVENOUS
  Filled 2024-12-20: qty 1

## 2024-12-20 MED ORDER — METOLAZONE 5 MG PO TABS
5.0000 mg | ORAL_TABLET | Freq: Once | ORAL | Status: AC
  Administered 2024-12-20: 5 mg via ORAL
  Filled 2024-12-20: qty 1

## 2024-12-20 MED ORDER — IOHEXOL 9 MG/ML PO SOLN
500.0000 mL | ORAL | Status: AC
  Administered 2024-12-20 (×2): 500 mL via ORAL

## 2024-12-20 NOTE — Plan of Care (Signed)
  Problem: Clinical Measurements: Goal: Respiratory complications will improve Outcome: Progressing Goal: Cardiovascular complication will be avoided Outcome: Progressing   Problem: Activity: Goal: Risk for activity intolerance will decrease Outcome: Progressing   Problem: Pain Managment: Goal: General experience of comfort will improve and/or be controlled Outcome: Progressing

## 2024-12-20 NOTE — TOC Progression Note (Signed)
 Transition of Care [facility]) - Progression Note    Patient Details  Name: Joseph Montoya MRN: [id number] Date of Birth: June 23, 1951  Transition of Care [facility]) CM/SW Contact  Lauraine JAYSON Carpen, LCSW Phone Number: 12/20/2024, 11:55 AM  Clinical Narrative:   CSW continues to follow progress.  Expected Discharge Plan: Home/Self Care Barriers to Discharge: Continued Medical Work up               Expected Discharge Plan and Services     Post Acute Care Choice: NA Living arrangements for the past 2 months: Single Family Home                                       Social Drivers of Health (SDOH) Interventions SDOH Screenings   Food Insecurity: No Food Insecurity (12/16/2024)  Housing: Low Risk (12/16/2024)  Transportation Needs: No Transportation Needs (12/16/2024)  Utilities: Not At Risk (12/16/2024)  Social Connections: Moderately Isolated (12/16/2024)  Tobacco Use: Low Risk  (06/26/2023)   Received from [facility]    Readmission Risk Interventions     No data to display

## 2024-12-20 NOTE — Progress Notes (Cosign Needed Addendum)
 [facility] CLINIC CARDIOLOGY PROGRESS NOTE   Patient ID: Joseph Montoya MRN: [id number] DOB/AGE: Oct 23, 1951 74 y.o.  Admit date: 12/15/2024 Referring Physician Dr Morene Bathe  Primary Physician [facility]  Primary Cardiologist None Reason for Consultation heart failure, AF  HPI: Joseph Montoya is a 74 y.o. male with no significant past medical who presented to the ED on 12/15/2024 for SOB, CP. Cardiology was consulted for further evaluation.   Interval History:  -Patient seen and examined this AM, sitting upright in hospital bed with family at bedside. -SOB continues to gradually improve. -BP stable. HR improved this AM.  Review of systems complete and found to be negative unless listed above   Vitals:   12/19/24 2313 12/20/24 0546 12/20/24 0606 12/20/24 0816  BP: (!) 105/51 131/69  125/60  Pulse: 63 82  87  Resp: 18 18  17   Temp: 98.4 F (36.9 C) 98.4 F (36.9 C)  98 F (36.7 C)  TempSrc:      SpO2: 96% 99%  98%  Weight:   104.2 kg   Height:         Intake/Output Summary (Last 24 hours) at 12/20/2024 0844 Last data filed at 12/20/2024 0400 Gross per 24 hour  Intake 480 ml  Output 1900 ml  Net -1420 ml     PHYSICAL EXAM General: Chronically ill appearing male, well nourished, in no acute distress. HEENT: Normocephalic and atraumatic. Neck: No JVD.  Lungs: Normal respiratory effort on 2L [place]. Clear bilaterally to auscultation. No wheezes, crackles, rhonchi.  Heart: Irregularly irregular, improved rate. Normal S1 and S2 without gallops or murmurs. Radial & DP pulses 2+ bilaterally. Abdomen: Non-distended appearing.  Msk: Normal strength and tone for age. Extremities: No clubbing, cyanosis or edema.   Neuro: Alert and oriented X 3. Psych: Mood appropriate, affect congruent.    LABS: Basic Metabolic Panel: Recent Labs    12/19/24 0440 12/20/24 0411  NA 140 139  K 4.5 4.5  CL 102 101  CO2 26 25  GLUCOSE 101* 108*  BUN 22 26*  CREATININE  0.96 1.04  CALCIUM 9.8 10.4*  MG 2.5* 2.5*   Liver Function Tests: Recent Labs    12/19/24 0440  AST 35  ALT 59*  ALKPHOS 115  BILITOT 0.9  PROT 7.2  ALBUMIN 4.3   No results for input(s): LIPASE, AMYLASE in the last 72 hours. CBC: Recent Labs    12/19/24 0440 12/20/24 0411  WBC 7.0 6.4  HGB 14.5 15.0  HCT 45.3 45.0  MCV 100.7* 98.0  PLT 148* 154   Cardiac Enzymes: No results for input(s): CKTOTAL, CKMB, CKMBINDEX, TROPONINIHS in the last 72 hours. BNP: No results for input(s): BNP in the last 72 hours. D-Dimer: No results for input(s): DDIMER in the last 72 hours. Hemoglobin A1C: No results for input(s): HGBA1C in the last 72 hours.  Fasting Lipid Panel: No results for input(s): CHOL, HDL, LDLCALC, TRIG, CHOLHDL, LDLDIRECT in the last 72 hours.  Thyroid Function Tests: No results for input(s): TSH, T4TOTAL, T3FREE, THYROIDAB in the last 72 hours.  Invalid input(s): FREET3  Anemia Panel: No results for input(s): VITAMINB12, FOLATE, FERRITIN, TIBC, IRON, RETICCTPCT in the last 72 hours.  No results found.   ECHO 12/2024: 1. Left ventricular ejection fraction, by estimation, is 50 to 55%. The left ventricle has low normal function. The left ventricle demonstrates regional wall motion abnormalities (see scoring diagram/findings for description). Left ventricular diastolic  function could not be evaluated.   2.  Right ventricular systolic function is normal. The right ventricular  size is normal.   3. Left atrial size was moderately dilated.   4. Right atrial size was mild to moderately dilated.   5. The mitral valve was not well visualized. Moderate mitral valve  regurgitation.   6. The aortic valve was not well visualized. Aortic valve regurgitation is mild to moderate. Aortic valve sclerosis is present, with no evidence of aortic valve stenosis.   7. Aortic dilatation noted. There is moderate dilatation of the  aortic  root, measuring 45 mm. There is mild dilatation of the ascending aorta,  measuring 40 mm.   TELEMETRY (personally reviewed): atrial fibrillation rate 80s  EKG (personally reviewed): AF RVR rate 144 bpm  DATA reviewed by me 12/20/24: last 24h vitals tele labs imaging I/O, hospitalist progress note  Principal Problem:   New onset of congestive heart failure (HCC) Active Problems:   Atrial fibrillation with RVR (HCC)   Thrombocytopenia   Elevated LFTs   Essential hypertension    ASSESSMENT AND PLAN: Joseph Montoya is a 74 y.o. male with no significant past medical who presented to the ED on 12/15/2024 for SOB, CP. Cardiology was consulted for further evaluation.   # Acute heart failure preserved EF # Atrial fibrillation RVR # Pneumonia # Hypertension Patient presented with SOB, suspected heart failure exacerbation as well as possible pneumonia. Also noted to be in AF RVR, rates now improved. Echo this admission with EF 50-55%, moderate MR, mild to moderate AR. -Will give a dose of metolazone  5 mg today to augment diuresis. Continue IV lasix  40 mg twice daily.  -Continue spironolactone  to 25 mg daily, farxiga  10 mg daily.  -Increase metoprolol  tartrate to 100 mg twice daily.  -Continue PO amiodarone  load as ordered. Can consider additional digoxin . -Continue eliquis  5 mg twice daily.   This patient's case was discussed and created with Dr. Florencio and he is in agreement.  Signed:  Danita Bloch, PA-C  12/20/2024, 8:44 AM [facility] Cardiology

## 2024-12-20 NOTE — Plan of Care (Signed)

## 2024-12-20 NOTE — Progress Notes (Signed)
 " PROGRESS NOTE    Joseph Montoya  FMW:[id number] DOB: Jan 01, 1951 DOA: 12/15/2024 PCP: [facility]   Brief Narrative:  74 y.o. year old male without significant medical history presenting to the ED with worsening shortness of breath, chest pain with radiation to the back for 1 week.  He was noted to be in A-fib with RVR so he was given Cardizem  intravenous push.  Chest x-ray with cardiomegaly and concern for vascular congestion.  He had mildly elevated troponin high.  Cardiology was consulted.  Admitted for management of new onset A-fib with RVR as well as possible congestive heart failure, needing cardiology consultation.  Needs home O2 evaluation bvefore discharge, Lives at home with his family.   Assessment & Plan:  Principal Problem:   New onset of congestive heart failure (HCC) Active Problems:   Atrial fibrillation with RVR (HCC)   Thrombocytopenia   Elevated LFTs   Essential hypertension    A-fib with RVR, RVR Resolved: Rates remain well-controlled denies any prior history of A-fib.    Initially started on oral metoprolol  tartrate 25 mg p.o. 3 times daily along with heparin  drip. TTE with LVEF 50 to 55% Now on amiodarone , metoprolol , and Eliquis , s/p digoxin  2/6. Rates well controlled.    New onset acute diastolic congestive heart failure, POA: Chest x-ray showed evidence of pulmonary edema.  Patient does have elevated proBNP.   Transthoracic echocardiogram LVEF 50-55%.    Strict intake output monitoring, fluid restriction of 1.5 L/day.   Cardiology consulted, appreciate assistance. Continue with farxiga  10 mg PO Daily, aldactone  Continue IV diuresis   Acute hypoxic respiratory failure,Resolved:  Weaned to RA -in the setting of new onset CHF and pneumonia -Goal oxygen saturation between 88-92% -Continue diuresis per above and antibiotics per below   Left upper lobe possible community-acquired bacterial pneumonia, POA: Seen on CT angio chest.   Continue with IV ceftriaxone  and doxycycline  for total of 3 days and then oral Augmentin  875 mg twice daily for 2 days. Negative respiratory panel by PCR  Thrombocytopenia, POA: Unclear etiology.  Continue to monitor closely.  Elevated ALT, POA: Right upper quadrant was done which showed evidence of hepatic steatosis.  Continues to improve INR is within normal limits.  Essential hypertension, POA: On Aldactone  and metoprolol   Class III obesity, POA: BMI 43.1.  Patient is likely candidate for GLP-1 receptor agonist therapy as an outpatient.  Disposition: Home  DVT prophylaxis: SCDs Start: 12/15/24 1808 apixaban  (ELIQUIS ) tablet 5 mg     Code Status: Full Code Family Communication: Daughter is at bedside Status is: Inpatient Remains inpatient appropriate because: IV diuresis    Subjective:  Feels breathing is stable, still having some shortness of breath, worse with movement.   Examination:  Physical Exam  Constitutional: In no distress.  Cardiovascular: Irregularly, irregular. No lower extremity edema  Pulmonary: Non labored breathing on room air, no wheezing, Rales at bases   Abdominal: Soft. Non distended and non tender Musculoskeletal: Normal range of motion.     Neurological: Alert and oriented to person, place, and time. Non focal  Skin: Skin is warm and dry.    Diet Orders (From admission, onward)     Start     Ordered   12/16/24 1128  Diet Heart Room service appropriate? Yes; Fluid consistency: Thin; Fluid restriction: 1500 mL Fluid  Diet effective now       Question Answer Comment  Room service appropriate? Yes   Fluid consistency: Thin   Fluid restriction:  1500 mL Fluid      12/16/24 1127            Objective: Vitals:   12/20/24 1128 12/20/24 1145 12/20/24 1229 12/20/24 1631  BP: 138/78  135/63 135/63  Pulse: 85  85 73  Resp:   18 (!) 22  Temp:   98.2 F (36.8 C) 97.9 F (36.6 C)  TempSrc:   Oral   SpO2:  97% 96% 97%  Weight:      Height:         Intake/Output Summary (Last 24 hours) at 12/20/2024 1649 Last data filed at 12/20/2024 1131 Gross per 24 hour  Intake 360 ml  Output 1700 ml  Net -1340 ml   Filed Weights   12/17/24 0500 12/19/24 0500 12/20/24 0606  Weight: 100.1 kg 105 kg 104.2 kg    Scheduled Meds:  amiodarone   400 mg Oral BID   Followed by   NOREEN ON 12/27/2024] amiodarone   200 mg Oral Daily   apixaban   5 mg Oral BID   dapagliflozin  propanediol  10 mg Oral Daily   dextromethorphan -guaiFENesin   1 tablet Oral BID   furosemide   40 mg Intravenous BID   metoprolol  tartrate  100 mg Oral BID   pantoprazole   40 mg Oral Daily   polyethylene glycol  17 g Oral Daily   sodium chloride  flush  3 mL Intravenous Q12H   spironolactone   25 mg Oral Daily   Continuous Infusions:    Nutritional status     Body mass index is 40.69 kg/m.  Data Reviewed:   CBC: Recent Labs  Lab 12/15/24 1434 12/16/24 0414 12/18/24 0400 12/19/24 0440 12/20/24 0411  WBC 7.0  7.0 7.6 7.7 7.0 6.4  NEUTROABS 4.6  --   --   --   --   HGB 14.0  13.9 13.2 13.9 14.5 15.0  HCT 43.5  42.2 40.3 43.2 45.3 45.0  MCV 101.2*  99.5 100.2* 100.9* 100.7* 98.0  PLT 138*  131* 130* 138* 148* 154   Basic Metabolic Panel: Recent Labs  Lab 12/15/24 1434 12/15/24 1808 12/16/24 0414 12/18/24 0400 12/19/24 0440 12/20/24 0411  NA 142  --  142 141 140 139  K 3.9  --  4.2 4.5 4.5 4.5  CL 108  --  106 103 102 101  CO2 25  --  25 27 26 25   GLUCOSE 126*  --  108* 98 101* 108*  BUN 14  --  15 26* 22 26*  CREATININE 0.87  --  0.81 1.01 0.96 1.04  CALCIUM 9.3  --  9.0 9.8 9.8 10.4*  MG  --  2.1  --   --  2.5* 2.5*   GFR: Estimated Creatinine Clearance: 67.8 mL/min (by C-G formula based on SCr of 1.04 mg/dL). Liver Function Tests: Recent Labs  Lab 12/15/24 1434 12/19/24 0440  AST 40 35  ALT 79* 59*  ALKPHOS 115 115  BILITOT 0.7 0.9  PROT 7.1 7.2  ALBUMIN 4.3 4.3   No results for input(s): LIPASE, AMYLASE in the last 168  hours. No results for input(s): AMMONIA in the last 168 hours. Coagulation Profile: Recent Labs  Lab 12/16/24 0942  INR 1.0   Cardiac Enzymes: No results for input(s): CKTOTAL, CKMB, CKMBINDEX, TROPONINI in the last 168 hours. BNP (last 3 results) Recent Labs    12/15/24 1434 12/20/24 0411  PROBNP 1,984.0* 1,322.0*   HbA1C: No results for input(s): HGBA1C in the last 72 hours.  CBG: Recent Labs  Lab  12/16/24 1019 12/17/24 0831 12/19/24 0836 12/20/24 0811  GLUCAP 103* 94 108* 131*   Lipid Profile: No results for input(s): CHOL, HDL, LDLCALC, TRIG, CHOLHDL, LDLDIRECT in the last 72 hours.  Thyroid Function Tests: No results for input(s): TSH, T4TOTAL, FREET4, T3FREE, THYROIDAB in the last 72 hours.  Anemia Panel: No results for input(s): VITAMINB12, FOLATE, FERRITIN, TIBC, IRON, RETICCTPCT in the last 72 hours. Sepsis Labs: Recent Labs  Lab 12/19/24 0440  PROCALCITON 0.11    Recent Results (from the past 240 hours)  Respiratory (~20 pathogens) panel by PCR     Status: None   Collection Time: 12/16/24 10:52 AM   Specimen: Nasopharyngeal Swab; Respiratory  Result Value Ref Range Status   Adenovirus NOT DETECTED NOT DETECTED Final   Coronavirus 229E NOT DETECTED NOT DETECTED Final    Comment: (NOTE) The Coronavirus on the Respiratory Panel, DOES NOT test for the novel  Coronavirus (2019 nCoV)    Coronavirus HKU1 NOT DETECTED NOT DETECTED Final   Coronavirus NL63 NOT DETECTED NOT DETECTED Final   Coronavirus OC43 NOT DETECTED NOT DETECTED Final   Metapneumovirus NOT DETECTED NOT DETECTED Final   Rhinovirus / Enterovirus NOT DETECTED NOT DETECTED Final   Influenza A NOT DETECTED NOT DETECTED Final   Influenza B NOT DETECTED NOT DETECTED Final   Parainfluenza Virus 1 NOT DETECTED NOT DETECTED Final   Parainfluenza Virus 2 NOT DETECTED NOT DETECTED Final   Parainfluenza Virus 3 NOT DETECTED NOT DETECTED Final    Parainfluenza Virus 4 NOT DETECTED NOT DETECTED Final   Respiratory Syncytial Virus NOT DETECTED NOT DETECTED Final   Bordetella pertussis NOT DETECTED NOT DETECTED Final   Bordetella Parapertussis NOT DETECTED NOT DETECTED Final   Chlamydophila pneumoniae NOT DETECTED NOT DETECTED Final   Mycoplasma pneumoniae NOT DETECTED NOT DETECTED Final    Comment: Performed at [facility], [address], [place], [place] [address]    Radiology Studies: No results found.   LOS: 5 days   Time spent= 35 mins  Alban Pepper, MD [facility]  If 7PM-7AM, please contact night-coverage  12/20/2024, 4:49 PM  "

## 2024-12-20 NOTE — Progress Notes (Incomplete)
"  ° °      Overnight   NAME: Joseph Montoya MRN: [id number] DOB : 02-19-1951    Date of Service   12/20/2024   HPI/Events of Note   HPI:  74 y.o. year old male without significant medical history presenting to the ED with worsening shortness of breath, chest pain with radiation to the back for 1 week.  He was noted to be in A-fib with RVR so he was given Cardizem  intravenous push.  Chest x-ray with cardiomegaly and concern for vascular congestion.  He had mildly elevated troponin high.  Cardiology was consulted.   Admitted for management of new onset A-fib with RVR as well as possible congestive heart failure, needing cardiology consultation.   Needs home O2 evaluation bvefore discharge, Lives at home with his family.   Overnight:  notified by RN of new onset 10/10 LLQ pain twisting and crampy in nature. Pain worsens with movement and with palpation. Pt reports pain started today, and he had a BM yesterday. CTA abdomin ordered, pain medication and UA ordered.   Physical Exam    Interventions/ Plan   CTA abdomin  UA  Pain medication    Updates     Laneta Gardener- Aram BSN RN CCRN AGACNP-BC Acute Care Nurse Practitioner [facility] [place]  "

## 2024-12-20 NOTE — Telephone Encounter (Signed)
 Heart Failure Stewardship Pharmacy Note  PCP: [facility] PCP-Cardiologist: None  HPI: [name] is a 74 y.o. male with no noted PMH who presented with shortness of breath and chest pain. On admission, proBNP was 1984, HS-troponin was 24, ALT was 79, and A1c was 6.3. Chest x-ray noted pulmonary vascular congestion with possible edema or infiltrate. TTE noted LVEF of 50-55%, moderate MR, mild to moderate AR, aortic dilation present.   Joseph Montoya: Creatinine, Ser  Date Value Ref Range Status  12/20/2024 1.04 0.61 - 1.24 mg/dL Final   BUN  Date Value Ref Range Status  12/20/2024 26 (H) 8 - 23 mg/dL Final   Potassium  Date Value Ref Range Status  12/20/2024 4.5 3.5 - 5.1 mmol/L Final   Sodium  Date Value Ref Range Status  12/20/2024 139 135 - 145 mmol/L Final   Magnesium  Date Value Ref Range Status  12/20/2024 2.5 (H) 1.7 - 2.4 mg/dL Final    Comment:    Performed at [facility], [address] [place], [place] [address]   Hgb A1c MFr Bld  Date Value Ref Range Status  12/15/2024 6.3 (H) 4.8 - 5.6 % Final    Comment:    (NOTE) Diagnosis of Diabetes The following HbA1c ranges recommended by the American Diabetes Association (ADA) may be used as an aid in the diagnosis of diabetes mellitus.  Hemoglobin             Suggested A1C NGSP%              Diagnosis  <5.7                   Non Diabetic  5.7-6.4                Pre-Diabetic  >6.4                   Diabetic  <7.0                   Glycemic control for                       adults with diabetes.     Digoxin  Level  Date Value Ref Range Status  12/20/2024 1.3 0.8 - 2.0 ng/mL Final    Comment:    Performed at [facility], [address] [place], [place] [address]   TSH  Date Value Ref Range Status  12/15/2024 2.800 0.350 - 4.500 uIU/mL Final    Comment:    Performed at [facility], [address] [place], [place] [address]    Current Heart Failure Medications:  Loop diuretic: furosemide  40 mg IV BID Beta-Blocker: metoprolol  tartrate 75 mg BID ACEI/ARB/ARNI: none MRA: spironolactone  25 mg daily SGLT2i: Farxiga  10 mg daily Other: none  Prior to admission Heart Failure Medications:  none  Assessment: 1. 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 NYHA class II-III symptoms.  -Symptoms: Patient reports feeling improved, but still has orthopnea and oxygen requirement. No LEE, but abdominal distention is present, even after BM. Suspect significant abdominal reservoir.  -Volume: Currently on furosmeide 40 mg IV BID. BMET today noted stable creatinine and up trending BUN. Would add metolazone  today to increase diuresis. Alternatively, could increase to 80 mg IV BID. -Hemodynamics: BP elevated. HR 90s in AF. S/p digoxin  IV, level this AM was 1.2. -SGLT2i: Continue Farxiga  10 mg daily. -  MRA: Agree with increasing spironolactone  to 25 mg daily. -ARNI: Could consider adding Entresto for modest benefit to HF symptoms and hospitalizations if BP remains elevated.  Plan: 1) Medication changes recommended at this time:  - Consider adding metolazone  5 mg daily  2) Patient assistance: -Farxiga  and Jardiance are each $12.65  3) Education: - Patient has been educated on current HF medications and potential additions to HF medication regimen - Patient verbalizes understanding that over the next few months, these medication doses may change and more medications may be added to optimize HF regimen - Patient has been educated on basic disease state pathophysiology and goals of therapy  Please do not hesitate to reach out with questions or concerns,  Jaun Bash, PharmD, CPP, BCPS, [facility] Heart Failure Pharmacist  Phone - [phone] 12/20/2024 8:13 AM
